# Patient Record
Sex: Female | Born: 1985 | Race: White | Hispanic: No | Marital: Married | Smoking: Current every day smoker
Health system: Southern US, Community
[De-identification: ages and names within clinical notes are randomized; demographics above are authoritative.]

## PROBLEM LIST (undated history)

## (undated) DIAGNOSIS — R569 Unspecified convulsions: Secondary | ICD-10-CM

## (undated) DIAGNOSIS — F329 Major depressive disorder, single episode, unspecified: Secondary | ICD-10-CM

## (undated) DIAGNOSIS — F32A Depression, unspecified: Secondary | ICD-10-CM

## (undated) DIAGNOSIS — F419 Anxiety disorder, unspecified: Secondary | ICD-10-CM

---
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
## 2006-05-31 ENCOUNTER — Emergency Department (HOSPITAL_COMMUNITY): Admission: EM | Admit: 2006-05-31 | Discharge: 2006-05-31 | Payer: Self-pay | Admitting: Emergency Medicine

## 2009-03-08 ENCOUNTER — Emergency Department (HOSPITAL_COMMUNITY): Admission: EM | Admit: 2009-03-08 | Discharge: 2009-03-08 | Payer: Self-pay | Admitting: Emergency Medicine

## 2009-10-23 ENCOUNTER — Emergency Department (HOSPITAL_COMMUNITY): Admission: EM | Admit: 2009-10-23 | Discharge: 2009-10-23 | Payer: Self-pay | Admitting: Emergency Medicine

## 2009-11-08 ENCOUNTER — Emergency Department (HOSPITAL_COMMUNITY): Admission: EM | Admit: 2009-11-08 | Discharge: 2009-11-08 | Payer: Self-pay | Admitting: Emergency Medicine

## 2009-12-15 ENCOUNTER — Encounter: Admission: RE | Admit: 2009-12-15 | Discharge: 2009-12-15 | Payer: Self-pay | Admitting: Neurology

## 2010-04-11 ENCOUNTER — Emergency Department (HOSPITAL_COMMUNITY): Admission: EM | Admit: 2010-04-11 | Discharge: 2010-04-12 | Payer: Self-pay | Admitting: Emergency Medicine

## 2010-07-22 ENCOUNTER — Emergency Department (HOSPITAL_COMMUNITY): Admission: EM | Admit: 2010-07-22 | Discharge: 2010-07-22 | Payer: Self-pay | Admitting: Emergency Medicine

## 2010-11-10 ENCOUNTER — Emergency Department (HOSPITAL_COMMUNITY): Admission: EM | Admit: 2010-11-10 | Discharge: 2010-01-05 | Payer: Self-pay | Admitting: Emergency Medicine

## 2011-02-16 LAB — URINALYSIS, ROUTINE W REFLEX MICROSCOPIC
Glucose, UA: NEGATIVE mg/dL
Ketones, ur: >80 mg/dL — AB
Nitrite: NEGATIVE
Protein, ur: 30 mg/dL — AB
Specific Gravity, Urine: 1.025 (ref 1.005–1.030)
Urobilinogen, UA: 0.2 mg/dL (ref 0.0–1.0)
pH: 6 (ref 5.0–8.0)

## 2011-02-16 LAB — DIFFERENTIAL
Basophils Absolute: 0 10*3/uL (ref 0.0–0.1)
Eosinophils Relative: 0 % (ref 0–5)
Lymphocytes Relative: 13 % (ref 12–46)
Lymphs Abs: 1.3 10*3/uL (ref 0.7–4.0)
Neutro Abs: 8.4 10*3/uL — ABNORMAL HIGH (ref 1.7–7.7)
Neutrophils Relative %: 84 % — ABNORMAL HIGH (ref 43–77)

## 2011-02-16 LAB — BASIC METABOLIC PANEL
BUN: 5 mg/dL — ABNORMAL LOW (ref 6–23)
Calcium: 9.2 mg/dL (ref 8.4–10.5)
Chloride: 102 mEq/L (ref 96–112)
Creatinine, Ser: 0.63 mg/dL (ref 0.4–1.2)
GFR calc Af Amer: >60 mL/min (ref >60)
GFR calc non Af Amer: >60 mL/min (ref >60)

## 2011-02-16 LAB — URINE MICROSCOPIC-ADD ON

## 2011-02-16 LAB — CBC
Platelets: 339 10*3/uL (ref 150–400)
RBC: 3.92 MIL/uL (ref 3.87–5.11)
RDW: 13 % (ref 11.5–15.5)
WBC: 10 10*3/uL (ref 4.0–10.5)

## 2011-02-21 LAB — URINALYSIS, ROUTINE W REFLEX MICROSCOPIC
Glucose, UA: NEGATIVE mg/dL
Ketones, ur: NEGATIVE mg/dL
Nitrite: POSITIVE — AB
Specific Gravity, Urine: 1.025 (ref 1.005–1.030)
pH: 7 (ref 5.0–8.0)

## 2011-02-21 LAB — URINE MICROSCOPIC-ADD ON

## 2011-02-21 LAB — PREGNANCY, URINE: Preg Test, Ur: POSITIVE

## 2011-03-07 LAB — COMPREHENSIVE METABOLIC PANEL
ALT: 15 U/L (ref 0–35)
AST: 20 U/L (ref 0–37)
Alkaline Phosphatase: 63 U/L (ref 39–117)
Calcium: 9.1 mg/dL (ref 8.4–10.5)
GFR calc Af Amer: >60 mL/min (ref >60)
Potassium: 4.2 mEq/L (ref 3.5–5.1)
Sodium: 138 mEq/L (ref 135–145)
Total Protein: 6.8 g/dL (ref 6.0–8.3)

## 2011-03-07 LAB — CBC
HCT: 40.2 % (ref 36.0–46.0)
Hemoglobin: 13.6 g/dL (ref 12.0–15.0)
MCHC: 33.8 g/dL (ref 30.0–36.0)
MCV: 94.8 fL (ref 78.0–100.0)
Platelets: 309 10*3/uL (ref 150–400)
RBC: 4.24 MIL/uL (ref 3.87–5.11)
RDW: 13.8 % (ref 11.5–15.5)
WBC: 11.3 10*3/uL — ABNORMAL HIGH (ref 4.0–10.5)

## 2011-03-07 LAB — DIFFERENTIAL
Eosinophils Absolute: 0 10*3/uL (ref 0.0–0.7)
Eosinophils Relative: 0 % (ref 0–5)
Lymphs Abs: 1.1 10*3/uL (ref 0.7–4.0)
Monocytes Absolute: 0.5 10*3/uL (ref 0.1–1.0)
Monocytes Relative: 4 % (ref 3–12)

## 2011-03-07 LAB — RAPID URINE DRUG SCREEN, HOSP PERFORMED
Amphetamines: NOT DETECTED
Cocaine: NOT DETECTED
Opiates: NOT DETECTED
Tetrahydrocannabinol: POSITIVE — AB

## 2011-03-07 LAB — ETHANOL: Alcohol, Ethyl (B): <5 mg/dL (ref 0–10)

## 2011-03-07 LAB — PREGNANCY, URINE: Preg Test, Ur: NEGATIVE

## 2011-03-08 LAB — COMPREHENSIVE METABOLIC PANEL
Albumin: 4.1 g/dL (ref 3.5–5.2)
BUN: 8 mg/dL (ref 6–23)
Chloride: 105 mEq/L (ref 96–112)
Creatinine, Ser: 0.77 mg/dL (ref 0.4–1.2)
GFR calc non Af Amer: >60 mL/min (ref >60)
Total Bilirubin: 0.7 mg/dL (ref 0.3–1.2)

## 2011-03-08 LAB — DIFFERENTIAL
Basophils Absolute: 0 10*3/uL (ref 0.0–0.1)
Lymphocytes Relative: 11 % — ABNORMAL LOW (ref 12–46)
Neutro Abs: 9.6 10*3/uL — ABNORMAL HIGH (ref 1.7–7.7)

## 2011-03-08 LAB — CBC
HCT: 38.7 % (ref 36.0–46.0)
MCV: 93.5 fL (ref 78.0–100.0)
Platelets: 351 10*3/uL (ref 150–400)
WBC: 11.3 10*3/uL — ABNORMAL HIGH (ref 4.0–10.5)

## 2011-03-08 LAB — URINALYSIS, ROUTINE W REFLEX MICROSCOPIC
Bilirubin Urine: NEGATIVE
Glucose, UA: NEGATIVE mg/dL
Hgb urine dipstick: NEGATIVE
Ketones, ur: NEGATIVE mg/dL
Leukocytes, UA: NEGATIVE
pH: 8.5 — ABNORMAL HIGH (ref 5.0–8.0)

## 2011-03-08 LAB — URINE CULTURE: Colony Count: >=100000

## 2011-03-08 LAB — URINE MICROSCOPIC-ADD ON

## 2011-03-08 LAB — RAPID URINE DRUG SCREEN, HOSP PERFORMED
Barbiturates: NOT DETECTED
Cocaine: NOT DETECTED
Opiates: NOT DETECTED

## 2012-08-21 DIAGNOSIS — R079 Chest pain, unspecified: Secondary | ICD-10-CM

## 2012-08-28 ENCOUNTER — Emergency Department (HOSPITAL_COMMUNITY)
Admission: EM | Admit: 2012-08-28 | Discharge: 2012-08-28 | Disposition: A | Discharge status: Home / Self Care | Payer: Medicaid Other | Source: Home / Self Care | Attending: Emergency Medicine | Admitting: Emergency Medicine

## 2012-08-28 ENCOUNTER — Emergency Department (HOSPITAL_COMMUNITY)
Admission: EM | Admit: 2012-08-28 | Discharge: 2012-08-28 | Disposition: A | Discharge status: Home / Self Care | Payer: Medicaid Other | Attending: Emergency Medicine | Admitting: Emergency Medicine

## 2012-08-28 ENCOUNTER — Encounter (HOSPITAL_COMMUNITY): Payer: Self-pay | Admitting: Emergency Medicine

## 2012-08-28 DIAGNOSIS — F430 Acute stress reaction: Secondary | ICD-10-CM

## 2012-08-28 DIAGNOSIS — Z760 Encounter for issue of repeat prescription: Secondary | ICD-10-CM

## 2012-08-28 DIAGNOSIS — F17200 Nicotine dependence, unspecified, uncomplicated: Secondary | ICD-10-CM | POA: Insufficient documentation

## 2012-08-28 DIAGNOSIS — G40909 Epilepsy, unspecified, not intractable, without status epilepticus: Secondary | ICD-10-CM | POA: Insufficient documentation

## 2012-08-28 DIAGNOSIS — Z79899 Other long term (current) drug therapy: Secondary | ICD-10-CM | POA: Insufficient documentation

## 2012-08-28 DIAGNOSIS — R4589 Other symptoms and signs involving emotional state: Secondary | ICD-10-CM | POA: Insufficient documentation

## 2012-08-28 HISTORY — DX: Unspecified convulsions: R56.9

## 2012-08-28 HISTORY — DX: Depression, unspecified: F32.A

## 2012-08-28 HISTORY — DX: Major depressive disorder, single episode, unspecified: F32.9

## 2012-08-28 HISTORY — DX: Anxiety disorder, unspecified: F41.9

## 2012-08-28 MED ORDER — ALPRAZOLAM 1 MG PO TABS: ORAL_TABLET | ORAL | Status: DC

## 2012-08-28 MED ORDER — LAMOTRIGINE 100 MG PO TABS: 100.0000 mg | ORAL_TABLET | Freq: Every day | ORAL | Status: DC

## 2012-08-28 MED ORDER — ALPRAZOLAM 0.5 MG PO TABS
1.0000 mg | ORAL_TABLET | Freq: Once | ORAL | Status: AC
  Administered 2012-08-28: 1 mg via ORAL
  Filled 2012-08-28: qty 2

## 2012-08-28 MED ORDER — LORAZEPAM 1 MG PO TABS
1.0000 mg | ORAL_TABLET | Freq: Once | ORAL | Status: DC
  Filled 2012-08-28: qty 1

## 2012-08-28 MED ORDER — LAMOTRIGINE 100 MG PO TABS
100.0000 mg | ORAL_TABLET | Freq: Once | ORAL | Status: AC
  Administered 2012-08-28: 100 mg via ORAL
  Filled 2012-08-28: qty 1

## 2012-08-28 MED ORDER — LORAZEPAM 1 MG PO TABS: 1.0000 mg | ORAL_TABLET | Freq: Three times a day (TID) | ORAL | Status: DC | PRN

## 2012-08-28 NOTE — ED Notes (Signed)
Pt was unhappy with prescription for ativan. Pt states ativan does not work for her. EDP notified and prescription was changed to xanax.

## 2012-08-28 NOTE — ED Provider Notes (Signed)
Medical screening examination/treatment/procedure(s) were performed by non-physician practitioner and as supervising physician I was immediately available for consultation/collaboration.   Carleene Cooper III, MD 08/28/12 2019

## 2012-08-28 NOTE — ED Provider Notes (Signed)
History     CSN: [id number]  Arrival date & time 08/28/12  2725   First MD Initiated Contact with Patient 08/28/12 1018      Chief Complaint  Patient presents with  . Weakness  . Altered Mental Status    (Consider location/radiation/quality/duration/timing/severity/associated sxs/prior treatment) HPI Comments: Pt was seen earlier this AM by dr./ davidson for anxiety.  She had been discharged and was sitting in the waiting room when she says she felt like she was going to have a seizure.  She has not had one in about 2 years.  She is on lamictal 100 mg BID but ran out 2 days ago.    Her MD has not called in her rx and asks if i could write the rx for her.  She denies any other issues.  Patient is a 26 y.o. female presenting with altered mental status. The history is provided by the patient. No language interpreter was used.  Altered Mental Status Associated symptoms include weakness. Pertinent negatives include no fever.    Past Medical History  Diagnosis Date  . Seizures   . Anxiety   . Depression     Past Surgical History  Procedure Date  . Cesarean section     History reviewed. No pertinent family history.  History  Substance Use Topics  . Smoking status: Current Every Day Smoker  . Smokeless tobacco: Not on file  . Alcohol Use: Yes    OB History    Grav Para Term Preterm Abortions TAB SAB Ect Mult Living                  Review of Systems  Constitutional: Negative for fever.  Neurological: Positive for weakness. Negative for seizures.  Psychiatric/Behavioral: Positive for altered mental status. Negative for confusion. The patient is nervous/anxious.   All other systems reviewed and are negative.    Allergies  Review of patient's allergies indicates no known allergies.  Home Medications   Current Outpatient Rx  Name Route Sig Dispense Refill  . ALPRAZOLAM 0.5 MG PO TABS Oral Take 0.5 mg by mouth at bedtime as needed.    . ALPRAZOLAM 1 MG PO TABS   Take Xanax 1 mg every 6 ho andurs as needed for anxiety. 30 tablet 0  . LAMOTRIGINE 100 MG PO TABS Oral Take 200 mg by mouth daily.    Marland Kitchen LAMOTRIGINE 100 MG PO TABS Oral Take 1 tablet (100 mg total) by mouth daily. 60 tablet 0  . LAMOTRIGINE 100 MG PO TABS Oral Take 1 tablet (100 mg total) by mouth daily. 60 tablet 0    BP 114/79  Pulse 93  Temp 97.9 F (36.6 C) (Oral)  Resp 17  SpO2 95%  LMP 08/04/2012  Physical Exam  Nursing note and vitals reviewed. Constitutional: She is oriented to person, place, and time. She appears well-developed and well-nourished.  HENT:  Head: Normocephalic.  Eyes: EOM are normal.  Neck: Normal range of motion.  Pulmonary/Chest: Effort normal.  Abdominal: She exhibits no distension.  Musculoskeletal: Normal range of motion.  Neurological: She is alert and oriented to person, place, and time. She has normal strength. No cranial nerve deficit or sensory deficit. Coordination and gait normal. GCS eye subscore is 4. GCS verbal subscore is 5. GCS motor subscore is 6.       Pt walking around room talking on her cell phone.  Skin: Skin is warm and dry.  Psychiatric: She has a normal mood and affect.  ED Course  Procedures (including critical care time)   Labs Reviewed  PREGNANCY, URINE   No results found.   1. Prescription refill       MDM  rx-lamictal 100 mg, 60 F/u with PCP        Evalina Field, PA 08/28/12 1119

## 2012-08-28 NOTE — ED Notes (Signed)
Pt just here this am for anxiety and released. Was waiting on ride and states she felt disoriented and like she was going to have a seizure. Has been feeling this way x 10 minutes now. Last seizure x 2 years ago. Out of seizure meds x 2 days. nad at this time. Pt is alert/oriented.
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
## 2013-05-11 ENCOUNTER — Other Ambulatory Visit: Payer: Self-pay

## 2013-05-11 MED ORDER — LAMOTRIGINE ER 100 MG PO TB24: 300.0000 mg | ORAL_TABLET | Freq: Every day | ORAL | Status: DC

## 2014-01-05 ENCOUNTER — Telehealth: Payer: Self-pay | Admitting: Neurology

## 2014-01-05 NOTE — Telephone Encounter (Signed)
Medicaid has changed their formulary.  They do not cover Lamictal XR.  I have contacted them and asked that they grant an exception, as the patient is stable on this medication.  Pending response.  I called the patient back.  Got no answer.  Left message.

## 2014-01-05 NOTE — Telephone Encounter (Signed)
REGARDING PRIOR AUTHORIZATION FROM MEDICAID FOR LAMICTAL XR 300MG 

## 2014-01-09 ENCOUNTER — Other Ambulatory Visit: Payer: Self-pay

## 2014-01-09 MED ORDER — LAMOTRIGINE ER 100 MG PO TB24: 300.0000 mg | ORAL_TABLET | Freq: Every day | ORAL | Status: DC

## 2014-02-06 ENCOUNTER — Telehealth: Payer: Self-pay | Admitting: *Deleted

## 2014-02-06 MED ORDER — LAMOTRIGINE ER 100 MG PO TB24: 300.0000 mg | ORAL_TABLET | Freq: Every day | ORAL | Status: DC

## 2014-02-06 NOTE — Telephone Encounter (Signed)
Rx has been sent  

## 2014-03-10 ENCOUNTER — Telehealth: Payer: Self-pay | Admitting: Neurology

## 2014-03-10 NOTE — Telephone Encounter (Signed)
Pt called.  Is completely out of her Lamictal XR.  She stated that they need prior authorization before the pharmacy will refill it.  They are using a new pharmacy that is closer to their home.  Please call and let them know how long it may take to get the authorization as well as if they need to come into the office to pick up samples to last her until the authorization process is completed.  Thank you

## 2014-03-10 NOTE — Telephone Encounter (Signed)
We already faxed the PA info to ins back in February.  I called ins.  Spoke with Selena BattenKim who [facility]  She said the prior Berkley Harveyauth was already approved for this medication (in Feb) Approval # [id number], approval valid for one year.  She said if the pharmacy is still having issues, they will need to contact Medicaid, as it would be an internal error.  Call Ref # [id number].  I called the patient back, got no answer.  Left message.

## 2014-04-17 ENCOUNTER — Encounter: Payer: Self-pay | Admitting: Neurology

## 2014-05-28 ENCOUNTER — Encounter: Payer: Self-pay | Admitting: Neurology

## 2014-06-08 ENCOUNTER — Other Ambulatory Visit: Payer: Self-pay | Admitting: Neurology

## 2014-06-08 NOTE — Telephone Encounter (Signed)
Patient has appt scheduled

## 2014-06-23 ENCOUNTER — Ambulatory Visit: Payer: Self-pay | Admitting: Neurology

## 2014-07-06 ENCOUNTER — Ambulatory Visit: Payer: Self-pay | Admitting: Neurology

## 2014-07-08 ENCOUNTER — Other Ambulatory Visit: Payer: Self-pay | Admitting: Neurology

## 2014-07-08 NOTE — Telephone Encounter (Signed)
Patient has appt scheduled

## 2014-07-10 ENCOUNTER — Encounter: Payer: Self-pay | Admitting: Neurology

## 2014-07-10 DIAGNOSIS — R569 Unspecified convulsions: Secondary | ICD-10-CM | POA: Insufficient documentation

## 2014-07-10 DIAGNOSIS — F329 Major depressive disorder, single episode, unspecified: Secondary | ICD-10-CM

## 2014-07-10 DIAGNOSIS — F419 Anxiety disorder, unspecified: Secondary | ICD-10-CM | POA: Insufficient documentation

## 2014-07-10 DIAGNOSIS — F32A Depression, unspecified: Secondary | ICD-10-CM | POA: Insufficient documentation

## 2014-07-13 ENCOUNTER — Ambulatory Visit (INDEPENDENT_AMBULATORY_CARE_PROVIDER_SITE_OTHER): Payer: Medicaid Other | Admitting: Neurology

## 2014-07-13 ENCOUNTER — Encounter: Payer: Self-pay | Admitting: Neurology

## 2014-07-13 VITALS — BP 120/79 | HR 98 | Wt 144.0 lb

## 2014-07-13 DIAGNOSIS — F329 Major depressive disorder, single episode, unspecified: Secondary | ICD-10-CM

## 2014-07-13 DIAGNOSIS — F3289 Other specified depressive episodes: Secondary | ICD-10-CM

## 2014-07-13 DIAGNOSIS — R569 Unspecified convulsions: Secondary | ICD-10-CM

## 2014-07-13 DIAGNOSIS — F32A Depression, unspecified: Secondary | ICD-10-CM

## 2014-07-13 MED ORDER — LAMOTRIGINE ER 100 MG PO TB24: 300.0000 mg | ORAL_TABLET | Freq: Every day | ORAL | Status: DC

## 2014-07-13 NOTE — Progress Notes (Addendum)
PATIENT: Debbie Schwartz DOB: 13-Sep-1986  HISTORICAL  Debbie Schwartz is 28 years old right-handed Caucasian female, accompanied by her husband, referred by her primary care physician for evaluation of anxiety, difficulty sleeping  most recent visit was in October 2013 with nurse practitioner Cecille Rubin, she presented with seizures since 2010, had first seizure reported in October 15 2009, generalized tonic-clonic seizure, CAT scan was essentially normal, MRI of the brain, and EEG was normal as well.  She had recurrent seizure November 08 2009, was loaded with Keppra at emergency room, later due to her complains of mood disorder, she was switched to Lamictal, currently taking lamotrigin 100 mg 3 tablets every night, tolerating well,  She went through normal pregnancy twice, while taking lamotrigine, reported last seizure was in 2011   REVIEW OF SYSTEMS: Full 14 system review of systems performed and notable only for memory loss, seizure, depression, anxiety  ALLERGIES: No Known Allergies  HOME MEDICATIONS: Current Outpatient Prescriptions on File Prior to Visit  Medication Sig Dispense Refill  . LamoTRIgine 100 MG TB24 TAKE (3) TABLETS BY MOUTH ONCE DAILY.  90 tablet  0  . ALPRAZolam (XANAX) 0.5 MG tablet Take 0.5 mg by mouth at bedtime as needed.      . ALPRAZolam (XANAX) 1 MG tablet Take Xanax 1 mg every 6 ho andurs as needed for anxiety.  30 tablet  0     PAST MEDICAL HISTORY: Past Medical History  Diagnosis Date  . Seizures   . Anxiety   . Depression     PAST SURGICAL HISTORY: Past Surgical History  Procedure Laterality Date  . Cesarean section      FAMILY HISTORY: Family History  Problem Relation Age of Onset  . Diabetes Father     SOCIAL HISTORY:  History   Social History  . Marital Status: Married    Spouse Name: N/A    Number of Children: 3  . Years of Education: N/A   Occupational History  . Not on file.   Social History Main Topics    . Smoking status: Current Every Day Smoker  . Smokeless tobacco: Never Used  . Alcohol Use: Yes  . Drug Use: Yes    Special: Marijuana  . Sexual Activity: Yes    Birth Control/ Protection: None   Other Topics Concern  . Not on file   Social History Narrative  . No narrative on file     PHYSICAL EXAM   Filed Vitals:   07/13/14 1110  BP: 120/79  Pulse: 98  Weight: 144 lb (65.318 kg)   Body mass index is 24.71 kg/(m^2).   Generalized: In no acute distress  Neck: Supple, no carotid bruits   Cardiac: Regular rate rhythm  Pulmonary: Clear to auscultation bilaterally  Musculoskeletal: No deformity  Neurological examination  Mentation: Alert oriented to time, place, history taking, and causual conversation  Cranial nerve II-XII: Pupils were equal round reactive to light. Extraocular movements were full.  Visual field were full on confrontational test. Bilateral fundi were sharp.  Facial sensation and strength were normal. Hearing was intact to finger rubbing bilaterally. Uvula tongue midline.  Head turning and shoulder shrug and were normal and symmetric.Tongue protrusion into cheek strength was normal.  Motor: Normal tone, bulk and strength.  Sensory: Intact to fine touch, pinprick, preserved vibratory sensation, and proprioception at toes.  Coordination: Normal finger to nose, heel-to-shin bilaterally there was no truncal ataxia  Gait: Rising up from seated position without assistance, normal  stance, without trunk ataxia, moderate stride, good arm swing, smooth turning, able to perform tiptoe, and heel walking without difficulty.   Romberg signs: Negative  Deep tendon reflexes: Brachioradialis 2/2, biceps 2/2, triceps 2/2, patellar 2/2, Achilles 2/2, plantar responses were flexor bilaterally.   DIAGNOSTIC DATA (LABS, IMAGING, TESTING) - I reviewed patient records, labs, notes, testing and imaging myself where available.  Lab Results  Component Value Date   WBC  10.0 07/22/2010   HGB 12.6 07/22/2010   HCT 36.8 07/22/2010   MCV 94.1 07/22/2010   PLT 339 07/22/2010      Component Value Date/Time   NA 135 07/22/2010 1855   K 3.2* 07/22/2010 1855   CL 102 07/22/2010 1855   CO2 23 07/22/2010 1855   GLUCOSE 92 07/22/2010 1855   BUN 5* 07/22/2010 1855   CREATININE 0.63 07/22/2010 1855   CALCIUM 9.2 07/22/2010 1855   PROT 6.8 11/08/2009 0842   ALBUMIN 3.8 11/08/2009 0842   AST 20 11/08/2009 0842   ALT 15 11/08/2009 0842   ALKPHOS 63 11/08/2009 0842   BILITOT 0.4 11/08/2009 0842   GFRNONAA >60 07/22/2010 1855   GFRAA  Value: >60        The eGFR has been calculated using the MDRD equation. This calculation has not been validated in all clinical situations. eGFR's persistently <60 mL/min signify possible Chronic Kidney Disease. 07/22/2010 1855  ASSESSMENT AND PLAN  Debbie Schwartz is a 29 y.o. female complains of  Depression, anxiety, history of seizure. Her seizures well controlled with Lamotrigin 100 mg 3 tablets every night, her main concern is her mood disorder, depression anxiety insomnia, I refilled her lamotrigin today, referred her to psychologist, she may continue later refills with her primary care physician     Marcial Pacas, M.D. Ph.D.  [facility] [address] [place], [place] [address] [phone]

## 2014-07-14 ENCOUNTER — Telehealth: Payer: Self-pay

## 2014-07-14 NOTE — Telephone Encounter (Signed)
Sp[facility] with [facility] at [facility] [phone] and they will call me back today with appt for patient as far as her psychiatry appt. Patient has to be set with a clinical assemment first.

## 2014-07-14 NOTE — Telephone Encounter (Signed)
Rhonda from [facility] calling to state that she has successfully scheduled an appointment for patient, if questions, please call and advise.

## 2014-07-15 NOTE — Telephone Encounter (Signed)
Patient called me back. All information about appt was given to her and she will be at her appt. @ 1:30 pm 07/16/2014.

## 2014-07-15 NOTE — Telephone Encounter (Signed)
Debbie Schwartz Called back and she is scheduled for appt  07-15-2014. At [facility] [place]  [address]  [address] [place] if patient needs to reschedule call [phone]. Please bring insurance card and her medication.  Called patient and left her a message asking for her to call me back so I could discuss details.

## 2014-07-15 NOTE — Telephone Encounter (Addendum)
Patients appt is at 1:30 Pm 07-16-2014.Called back left her another message to cal me back. I will call [facility] to reschedule her appt because I was unable to reach patient.

## 2014-09-15 ENCOUNTER — Emergency Department (HOSPITAL_COMMUNITY)
Admission: EM | Admit: 2014-09-15 | Discharge: 2014-09-15 | Disposition: A | Discharge status: Home / Self Care | Payer: Medicaid Other | Attending: Emergency Medicine | Admitting: Emergency Medicine

## 2014-09-15 ENCOUNTER — Encounter (HOSPITAL_COMMUNITY): Payer: Self-pay | Admitting: Emergency Medicine

## 2014-09-15 DIAGNOSIS — Z79899 Other long term (current) drug therapy: Secondary | ICD-10-CM | POA: Diagnosis not present

## 2014-09-15 DIAGNOSIS — Z720 Tobacco use: Secondary | ICD-10-CM | POA: Diagnosis not present

## 2014-09-15 DIAGNOSIS — G40909 Epilepsy, unspecified, not intractable, without status epilepticus: Secondary | ICD-10-CM | POA: Insufficient documentation

## 2014-09-15 DIAGNOSIS — F419 Anxiety disorder, unspecified: Secondary | ICD-10-CM | POA: Diagnosis not present

## 2014-09-15 DIAGNOSIS — F329 Major depressive disorder, single episode, unspecified: Secondary | ICD-10-CM | POA: Diagnosis not present

## 2014-09-15 MED ORDER — ALPRAZOLAM 0.5 MG PO TABS
0.5000 mg | ORAL_TABLET | Freq: Once | ORAL | Status: AC
  Administered 2014-09-15: 0.5 mg via ORAL
  Filled 2014-09-15: qty 1

## 2014-09-15 NOTE — ED Provider Notes (Signed)
CSN: [id number]     Arrival date & time 09/15/14  1433 History   First MD Initiated Contact with Patient 09/15/14 1457     Chief Complaint  Patient presents with  . Anxiety     (Consider location/radiation/quality/duration/timing/severity/associated sxs/prior Treatment) Patient is a 28 y.o. female presenting with anxiety. The history is provided by the patient (the pt states she was having a panick attack at her counselar's office.   she feels fine now).  Anxiety This is a recurrent problem. The current episode started less than 1 hour ago. The problem occurs constantly. The problem has been gradually improving. Pertinent negatives include no chest pain, no abdominal pain and no headaches. Nothing aggravates the symptoms. Nothing relieves the symptoms.    Past Medical History  Diagnosis Date  . Seizures   . Anxiety   . Depression    Past Surgical History  Procedure Laterality Date  . Cesarean section     Family History  Problem Relation Age of Onset  . Diabetes Father    History  Substance Use Topics  . Smoking status: Current Every Day Smoker  . Smokeless tobacco: Never Used  . Alcohol Use: Yes   OB History   Grav Para Term Preterm Abortions TAB SAB Ect Mult Living                 Review of Systems  Constitutional: Negative for appetite change and fatigue.  HENT: Negative for congestion, ear discharge and sinus pressure.   Eyes: Negative for discharge.  Respiratory: Negative for cough.   Cardiovascular: Negative for chest pain.  Gastrointestinal: Negative for abdominal pain and diarrhea.  Genitourinary: Negative for frequency and hematuria.  Musculoskeletal: Negative for back pain.  Skin: Negative for rash.  Neurological: Negative for seizures and headaches.  Psychiatric/Behavioral: Positive for agitation. Negative for hallucinations.      Allergies  Review of patient's allergies indicates no known allergies.  Home Medications   Prior to Admission  medications   Medication Sig Start Date End Date Taking? Authorizing Provider  FLUoxetine (PROZAC) 40 MG capsule Take 40 mg by mouth daily.   Yes Historical Provider, MD  LamoTRIgine 100 MG TB24 Take 3 tablets (300 mg total) by mouth daily. 07/13/14  Yes Levert FeinsteinYijun Yan, MD   BP 99/63  Pulse 102  Temp(Src) 98.6 F (37 C) (Oral)  Resp 16  Ht 5\' 4"  (1.626 m)  Wt 145 lb (65.772 kg)  BMI 24.88 kg/m2  SpO2 98% Physical Exam  Constitutional: She is oriented to person, place, and time. She appears well-developed.  HENT:  Head: Normocephalic.  Eyes: Conjunctivae and EOM are normal. No scleral icterus.  Neck: Neck supple. No thyromegaly present.  Cardiovascular: Normal rate and regular rhythm.  Exam reveals no gallop and no friction rub.   No murmur heard. Pulmonary/Chest: No stridor. She has no wheezes. She has no rales. She exhibits no tenderness.  Abdominal: She exhibits no distension. There is no tenderness. There is no rebound.  Musculoskeletal: Normal range of motion. She exhibits no edema.  Lymphadenopathy:    She has no cervical adenopathy.  Neurological: She is oriented to person, place, and time. She exhibits normal muscle tone. Coordination normal.  Skin: No rash noted. No erythema.  Psychiatric: She has a normal mood and affect. Her behavior is normal.  anxiety    ED Course  Procedures (including critical care time) Labs Review Labs Reviewed - No data to display  Imaging Review No results found.   EKG  Interpretation None      MDM   Final diagnoses:  Anxiety        Benny LennertJoseph L Nakenya Theall, MD 09/15/14 1732

## 2014-09-15 NOTE — Discharge Instructions (Signed)
Follow up with your counsel ar next week

## 2014-09-15 NOTE — ED Notes (Signed)
Patient with c/o anxiety while at a counseling session. Patient noted to hyperventilate and c/o numbness to hands while hyperventilating. Patient arrives alert/oriented x 4. No distress.

## 2015-07-19 ENCOUNTER — Telehealth: Payer: Self-pay | Admitting: Neurology

## 2015-07-19 NOTE — Telephone Encounter (Signed)
Please let patient know, it is okay to take her to the as prescribed by her psychiatrist, she should take lamotrigine xr 100 mg 3 tablets every night, jessica, please check on the prior approval process for her.

## 2015-07-19 NOTE — Telephone Encounter (Addendum)
I called Medicaid.  Spoke with Debbie Schwartz.  Provided clinical info.  Request is under review.  (They have her listed as Debbie Schwartz)  I called the patient back to advise ins has been contacted, and also relay providers note.  Got no answer.  Left message.   Medicaid has approved the request for coverage on Lamotrigine ER effective until 07/13/2016 Ref # [id number]

## 2015-07-19 NOTE — Telephone Encounter (Signed)
Pt called and wanted to notify us that she will need prior approval on her medication as well.

## 2015-07-19 NOTE — Telephone Encounter (Signed)
Debbie Schwartz called regarding refill on LamoTRIgine 100 MG TB24 and he wants to make sure that it's ok for her to take Latuda   (prescribed by Psych), since she has seizures

## 2015-08-19 ENCOUNTER — Other Ambulatory Visit: Payer: Self-pay

## 2015-08-19 MED ORDER — LAMOTRIGINE ER 100 MG PO TB24: 300.0000 mg | ORAL_TABLET | Freq: Every day | ORAL | Status: DC

## 2015-08-26 ENCOUNTER — Encounter: Payer: Self-pay | Admitting: Neurology

## 2015-08-26 ENCOUNTER — Ambulatory Visit (INDEPENDENT_AMBULATORY_CARE_PROVIDER_SITE_OTHER): Payer: Medicaid Other | Admitting: Neurology

## 2015-08-26 VITALS — Ht 64.0 in | Wt 141.0 lb

## 2015-08-26 DIAGNOSIS — R569 Unspecified convulsions: Secondary | ICD-10-CM

## 2015-08-26 DIAGNOSIS — G253 Myoclonus: Secondary | ICD-10-CM

## 2015-08-26 DIAGNOSIS — F32A Depression, unspecified: Secondary | ICD-10-CM

## 2015-08-26 DIAGNOSIS — G4719 Other hypersomnia: Secondary | ICD-10-CM

## 2015-08-26 DIAGNOSIS — F329 Major depressive disorder, single episode, unspecified: Secondary | ICD-10-CM

## 2015-08-26 MED ORDER — LAMOTRIGINE ER 100 MG PO TB24: 300.0000 mg | ORAL_TABLET | Freq: Every day | ORAL | Status: DC

## 2015-08-26 NOTE — Progress Notes (Signed)
Chief Complaint  Patient presents with  . Seizures    Reports her last seizure occurred either March or April of this year.  Says her anxiety was also uncontrolled at the time.  She is now seeing a psychiatrist and has been started on Xanax and [place].  . Insomnia    She has been having difficulty falling asleep and has excessive daytime somnolence.      PATIENT: Debbie Schwartz DOB: [birth year]  HISTORICAL  Debbie Schwartz is 29 years old right-handed Caucasian female, accompanied by her husband, referred by her primary care physician for evaluation of anxiety, difficulty sleeping  most recent visit was in October 2013 with nurse practitioner Cecille Rubin, she presented with seizures since 2010, had first seizure reported in October 15 2009, generalized tonic-clonic seizure, CAT scan was essentially normal, MRI of the brain, and EEG was normal as well.  She had recurrent seizure November 08 2009, was loaded with Keppra at emergency room, later due to her complains of mood disorder, she was switched to Lamictal, currently taking lamotrigin 100 mg 3 tablets every night, tolerating well,  She went through normal pregnancy twice, while taking lamotrigine, reported last seizure was in 2013.  UPDATE Aug 26 2015: She has no recurrent seizure, was taken to the emergency room October 2015 for an anxiety episode, happened during her psychotherapy section, with hyperventilation, bilateral hands. Numbness, posturing,  She tolerating lamotrigine xr 100 mg 3 tablets a day, also taking Latuda, her depression is under better control,  Today she complains of excessive fatigue, daytime sleepiness, ESS 10, FSS 43. She also has frequent awakening at night time, frequent body jerking movement.   REVIEW OF SYSTEMS: Full 14 system review of systems performed and notable only for shortness of breath, abdominal pain, back pain, neck pain, neck stiffness, memory loss, seizure, anxiety, daytime sleepiness,  snoring  ALLERGIES: No Known Allergies  HOME MEDICATIONS: Current Outpatient Prescriptions on File Prior to Visit  Medication Sig Dispense Refill  . LamoTRIgine 100 MG TB24 TAKE (3) TABLETS BY MOUTH ONCE DAILY.  90 tablet  0  . ALPRAZolam (XANAX) 0.5 MG tablet Take 0.5 mg by mouth at bedtime as needed.      . ALPRAZolam (XANAX) 1 MG tablet Take Xanax 1 mg every 6 ho andurs as needed for anxiety.  30 tablet  0     PAST MEDICAL HISTORY: Past Medical History  Diagnosis Date  . Seizures   . Anxiety   . Depression     PAST SURGICAL HISTORY: Past Surgical History  Procedure Laterality Date  . Cesarean section      FAMILY HISTORY: Family History  Problem Relation Age of Onset  . Diabetes Father     SOCIAL HISTORY:  History   Social History  . Marital Status: Married    Spouse Name: N/A    Number of Children: 3  . Years of Education: N/A   Occupational History  . Not on file.   Social History Main Topics  . Smoking status: Current Every Day Smoker  . Smokeless tobacco: Never Used  . Alcohol Use: Yes  . Drug Use: Yes    Special: Marijuana  . Sexual Activity: Yes    Birth Control/ Protection: None   Other Topics Concern  . Not on file   Social History Narrative  . No narrative on file     PHYSICAL EXAM   Filed Vitals:   08/26/15 0914  Height: '5\' 4"'  (1.626 m)  Weight: 141  lb (63.957 kg)   Body mass index is 24.19 kg/(m^2).   Generalized: In no acute distress  Neck: Supple, no carotid bruits   Cardiac: Regular rate rhythm  Pulmonary: Clear to auscultation bilaterally  Musculoskeletal: No deformity  Neurological examination  Mentation: Alert oriented to time, place, history taking, and causual conversation  Cranial nerve II-XII: Pupils were equal round reactive to light. Extraocular movements were full.  Visual field were full on confrontational test. Bilateral fundi were sharp.  Facial sensation and strength were normal. Hearing was intact  to finger rubbing bilaterally. Uvula tongue midline.  Head turning and shoulder shrug and were normal and symmetric.Tongue protrusion into cheek strength was normal.  Motor: Normal tone, bulk and strength.  Sensory: Intact to fine touch, pinprick, preserved vibratory sensation, and proprioception at toes.  Coordination: Normal finger to nose, heel-to-shin bilaterally there was no truncal ataxia  Gait: Rising up from seated position without assistance, normal stance, without trunk ataxia, moderate stride, good arm swing, smooth turning, able to perform tiptoe, and heel walking without difficulty.   Romberg signs: Negative  Deep tendon reflexes: Brachioradialis 2/2, biceps 2/2, triceps 2/2, patellar 2/2, Achilles 2/2, plantar responses were flexor bilaterally.   DIAGNOSTIC DATA (LABS, IMAGING, TESTING) - I reviewed patient records, labs, notes, testing and imaging myself where available.  Lab Results  Component Value Date   WBC 10.0 07/22/2010   HGB 12.6 07/22/2010   HCT 36.8 07/22/2010   MCV 94.1 07/22/2010   PLT 339 07/22/2010      Component Value Date/Time   NA 135 07/22/2010 1855   K 3.2* 07/22/2010 1855   CL 102 07/22/2010 1855   CO2 23 07/22/2010 1855   GLUCOSE 92 07/22/2010 1855   BUN 5* 07/22/2010 1855   CREATININE 0.63 07/22/2010 1855   CALCIUM 9.2 07/22/2010 1855   PROT 6.8 11/08/2009 0842   ALBUMIN 3.8 11/08/2009 0842   AST 20 11/08/2009 0842   ALT 15 11/08/2009 0842   ALKPHOS 63 11/08/2009 0842   BILITOT 0.4 11/08/2009 0842   GFRNONAA >60 07/22/2010 1855   GFRAA  07/22/2010 1855    >60        The eGFR has been calculated using the MDRD equation. This calculation has not been validated in all clinical situations. eGFR's persistently <60 mL/min signify possible Chronic Kidney Disease.  ASSESSMENT AND PLAN  Debbie Schwartz is a 29 y.o. female   Generalized seizure  Refilled her lamotrigine xr 100 mg 3 tablets every night  Depression  anxiety  Continue Latuda, lamotrigine Sleep-related myoclonic jerking,   excessive daytime sleepiness, fatigue,ESS score is 10, FSS score is 43  Sleep study refer    Marcial Pacas, M.D. Ph.D.  [facility] [address], [place], [place] [address] [phone]

## 2015-09-09 ENCOUNTER — Ambulatory Visit: Payer: Medicaid Other | Admitting: Neurology

## 2015-11-10 ENCOUNTER — Other Ambulatory Visit (HOSPITAL_COMMUNITY): Payer: Self-pay | Admitting: Family Medicine

## 2015-11-10 DIAGNOSIS — N6310 Unspecified lump in the right breast, unspecified quadrant: Secondary | ICD-10-CM

## 2015-11-16 ENCOUNTER — Ambulatory Visit (HOSPITAL_COMMUNITY)
Admission: RE | Admit: 2015-11-16 | Discharge: 2015-11-16 | Disposition: A | Discharge status: Home / Self Care | Payer: Medicaid Other | Source: Ambulatory Visit | Attending: Family Medicine | Admitting: Family Medicine

## 2015-11-16 DIAGNOSIS — N63 Unspecified lump in breast: Secondary | ICD-10-CM | POA: Diagnosis not present

## 2015-11-16 DIAGNOSIS — N644 Mastodynia: Secondary | ICD-10-CM | POA: Insufficient documentation

## 2015-11-16 DIAGNOSIS — N6310 Unspecified lump in the right breast, unspecified quadrant: Secondary | ICD-10-CM

## 2015-11-24 DIAGNOSIS — Z0271 Encounter for disability determination: Secondary | ICD-10-CM

## 2015-11-30 ENCOUNTER — Telehealth: Payer: Self-pay

## 2015-11-30 ENCOUNTER — Ambulatory Visit: Payer: Medicaid Other | Admitting: Neurology

## 2015-11-30 NOTE — Telephone Encounter (Signed)
Patient did not come to a follow up appointment today.  

## 2015-12-01 ENCOUNTER — Encounter: Payer: Self-pay | Admitting: Neurology

## 2016-02-01 ENCOUNTER — Encounter: Payer: Self-pay | Admitting: Gastroenterology

## 2016-02-18 ENCOUNTER — Encounter: Payer: Self-pay | Admitting: Gastroenterology

## 2016-02-18 ENCOUNTER — Ambulatory Visit: Payer: Medicaid Other | Admitting: Gastroenterology

## 2016-02-18 ENCOUNTER — Telehealth: Payer: Self-pay | Admitting: Gastroenterology

## 2016-02-18 NOTE — Telephone Encounter (Signed)
PATIENT WAS A NO SHOW AND LETTER SENT  °

## 2016-09-27 ENCOUNTER — Encounter: Payer: Self-pay | Admitting: Neurology

## 2016-09-27 ENCOUNTER — Ambulatory Visit (INDEPENDENT_AMBULATORY_CARE_PROVIDER_SITE_OTHER): Payer: Medicaid Other | Admitting: Neurology

## 2016-09-27 VITALS — BP 114/72 | HR 96 | Ht 64.0 in | Wt 146.0 lb

## 2016-09-27 DIAGNOSIS — F419 Anxiety disorder, unspecified: Secondary | ICD-10-CM | POA: Diagnosis not present

## 2016-09-27 DIAGNOSIS — R569 Unspecified convulsions: Secondary | ICD-10-CM

## 2016-09-27 MED ORDER — LAMOTRIGINE ER 100 MG PO TB24: 300.0000 mg | ORAL_TABLET | Freq: Every day | ORAL | 4 refills | Status: DC

## 2016-09-27 NOTE — Addendum Note (Signed)
Addended by: Levert FeinsteinYAN, Dwan Fennel on: 09/27/2016 10:02 AM   Modules accepted: Orders

## 2016-09-27 NOTE — Progress Notes (Addendum)
Chief Complaint  Patient presents with  . Seizures    She is here with her husband, Gwyndolyn Saxon.  Reports one seizure since last seen (estimates it being seven months ago).  Denies missing any medication around the time of this event.      PATIENT: Debbie Schwartz DOB: 1986-05-30  HISTORICAL  Debbie Schwartz is 30 years old right-handed Caucasian female follow-up for seizure,  She had seizures since 2010, had first seizure was in October 15 2009, generalized tonic-clonic seizure, CAT scan was essentially normal, MRI of the brain, and EEG was normal as well.  She had recurrent seizure on November 08 2009, was loaded with Keppra at emergency room, later due to her complains of mood disorder, she was switched to Lamictal, currently taking lamotrigin 100 mg 3 tablets every night, tolerating well,  She went through normal pregnancy twice, while taking lamotrigine, reported last seizure was in 2013.  UPDATE Aug 26 2015: She has no recurrent seizure, was taken to the emergency room October 2015 for an anxiety episode, happened during her psychotherapy section, with hyperventilation, bilateral hands numbness, posturing,  She tolerating lamotrigine xr 100 mg 3 tablets a day, also taking Latuda, her depression is under better control,  Today she complains of excessive fatigue, daytime sleepiness, ESS 10, FSS 43. She also has frequent awakening at night time, frequent body jerking movement.   UPDATE Oct 25th 2017: She had one recurrent episode of seizure-like activity during her psychotherapy section for her anxiety disorder, her hands looked up, she became nervous, become confused, but there was no total loss of consciousness, she has been complying with her lamotrigine XL 100 mg 3 tablets every night, this is only medication she is taking no  REVIEW OF SYSTEMS: Full 14 system review of systems performed and notable only for  seizure   ALLERGIES: No Known Allergies  HOME  MEDICATIONS: Current Outpatient Prescriptions on File Prior to Visit  Medication Sig Dispense Refill  . LamoTRIgine 100 MG TB24 TAKE (3) TABLETS BY MOUTH ONCE DAILY.  90 tablet  0  . ALPRAZolam (XANAX) 0.5 MG tablet Take 0.5 mg by mouth at bedtime as needed.      . ALPRAZolam (XANAX) 1 MG tablet Take Xanax 1 mg every 6 ho andurs as needed for anxiety.  30 tablet  0     PAST MEDICAL HISTORY: Past Medical History:  Diagnosis Date  . Anxiety   . Depression   . Seizures ([place])     PAST SURGICAL HISTORY: Past Surgical History:  Procedure Laterality Date  . CESAREAN SECTION      FAMILY HISTORY: Family History  Problem Relation Age of Onset  . Diabetes Father     SOCIAL HISTORY:  History   Social History  . Marital Status: Married    Spouse Name: N/A    Number of Children: 3  . Years of Education: N/A   Occupational History  . Not on file.   Social History Main Topics  . Smoking status: Current Every Day Smoker  . Smokeless tobacco: Never Used  . Alcohol Use: Yes  . Drug Use: Yes    Special: Marijuana  . Sexual Activity: Yes    Birth Control/ Protection: None   Other Topics Concern  . Not on file   Social History Narrative  . No narrative on file     PHYSICAL EXAM   Vitals:   09/27/16 0931  BP: 114/72  Pulse: 96  Weight: 146 lb (66.2 kg)  Height: '5\' 4"'  (1.626 m)   Body mass index is 25.06 kg/m.   Generalized: In no acute distress  Neck: Supple, no carotid bruits   Cardiac: Regular rate rhythm  Pulmonary: Clear to auscultation bilaterally  Musculoskeletal: No deformity  Neurological examination  Mentation: Alert oriented to time, place, history taking, and causual conversation  Cranial nerve II-XII: Pupils were equal round reactive to light. Extraocular movements were full.  Visual field were full on confrontational test. Bilateral fundi were sharp.  Facial sensation and strength were normal. Hearing was intact to finger rubbing  bilaterally. Uvula tongue midline.  Head turning and shoulder shrug and were normal and symmetric.Tongue protrusion into cheek strength was normal.  Motor: Normal tone, bulk and strength.  Sensory: Intact to fine touch, pinprick, preserved vibratory sensation, and proprioception at toes.  Coordination: Normal finger to nose, heel-to-shin bilaterally there was no truncal ataxia  Gait: Rising up from seated position without assistance, normal stance, without trunk ataxia, moderate stride, good arm swing, smooth turning, able to perform tiptoe, and heel walking without difficulty.   Romberg signs: Negative  Deep tendon reflexes: Brachioradialis 2/2, biceps 2/2, triceps 2/2, patellar 2/2, Achilles 2/2, plantar responses were flexor bilaterally.   DIAGNOSTIC DATA (LABS, IMAGING, TESTING) - I reviewed patient records, labs, notes, testing and imaging myself where available.  Lab Results  Component Value Date   WBC 10.0 07/22/2010   HGB 12.6 07/22/2010   HCT 36.8 07/22/2010   MCV 94.1 07/22/2010   PLT 339 07/22/2010      Component Value Date/Time   NA 135 07/22/2010 1855   K 3.2 (L) 07/22/2010 1855   CL 102 07/22/2010 1855   CO2 23 07/22/2010 1855   GLUCOSE 92 07/22/2010 1855   BUN 5 (L) 07/22/2010 1855   CREATININE 0.63 07/22/2010 1855   CALCIUM 9.2 07/22/2010 1855   PROT 6.8 11/08/2009 0842   ALBUMIN 3.8 11/08/2009 0842   AST 20 11/08/2009 0842   ALT 15 11/08/2009 0842   ALKPHOS 63 11/08/2009 0842   BILITOT 0.4 11/08/2009 0842   GFRNONAA >60 07/22/2010 1855   GFRAA  07/22/2010 1855    >60        The eGFR has been calculated using the MDRD equation. This calculation has not been validated in all clinical situations. eGFR's persistently <60 mL/min signify possible Chronic Kidney Disease.  ASSESSMENT AND PLAN  UNDINE NEALIS is a 30 y.o. female   Generalized seizure  Refilled her lamotrigine xr 100 mg 3 tablets every night  Depression anxiety  She is receiving  psychotherapy Sleep-related myoclonic jerking,   Repeat sleep deprived EEG  Check lamotrigine level    Marcial Pacas, M.D. Ph.D.  [facility] [address], [place], [place] [address] [phone]

## 2016-09-29 ENCOUNTER — Telehealth: Payer: Self-pay | Admitting: *Deleted

## 2016-09-29 LAB — LAMOTRIGINE LEVEL: LAMOTRIGINE LVL: 8.1 ug/mL (ref 2.0–20.0)

## 2016-09-29 NOTE — Telephone Encounter (Signed)
Per Dr Terrace ArabiaYan, LVM informing lamotrigine level was 8.1 which is within the normal limit. Left name, number and office hours for any questions.

## 2016-10-12 ENCOUNTER — Ambulatory Visit (INDEPENDENT_AMBULATORY_CARE_PROVIDER_SITE_OTHER): Payer: Medicaid Other | Admitting: Neurology

## 2016-10-12 DIAGNOSIS — R569 Unspecified convulsions: Secondary | ICD-10-CM

## 2016-10-12 DIAGNOSIS — F419 Anxiety disorder, unspecified: Secondary | ICD-10-CM

## 2016-10-18 NOTE — Procedures (Signed)
   HISTORY: 30 years old female, with history of seizure,  TECHNIQUE:  16 channel EEG was performed based on standard 10-16 international system. One channel was dedicated to EKG, which has demonstrates normal sinus rhythm of 78 beats per minutes.  Upon awakening, the posterior background activity was well-developed, in alpha range, with amplitude of microvoltage, reactive to eye opening and closure.  There was no evidence of epileptiform discharge.  Photic stimulation was performed, which induced a symmetric photic driving.  Hyperventilation was performed, there was no abnormality elicit.  Stage II sleep was achieved.  CONCLUSION: This is a  normal awake and sleep EEG.  There is no electrodiagnostic evidence of epileptiform discharge.  Levert FeinsteinYijun Jontez Redfield, M.D. Ph.D.  [facility] [address] [place], [place] [address] Phone: [phone] Fax:      [phone]

## 2017-08-29 IMAGING — US US BREAST LTD UNI RIGHT INC AXILLA
1 series · 2 of 2 positions shown · non-contrast
Comparison: Previous exam(s).

CLINICAL DATA: Area of tenderness and palpable concern in the right
breast upper outer quadrant, felt by the patient. Patient also
mentioned having nonspontaneous creamy nipple discharge.

EXAM:
ULTRASOUND OF THE RIGHT BREAST

[Series 1: us breast ltd uni right inc axilla · 0.07mm/px · 2 of 2 slices shown]
[im 1/2]
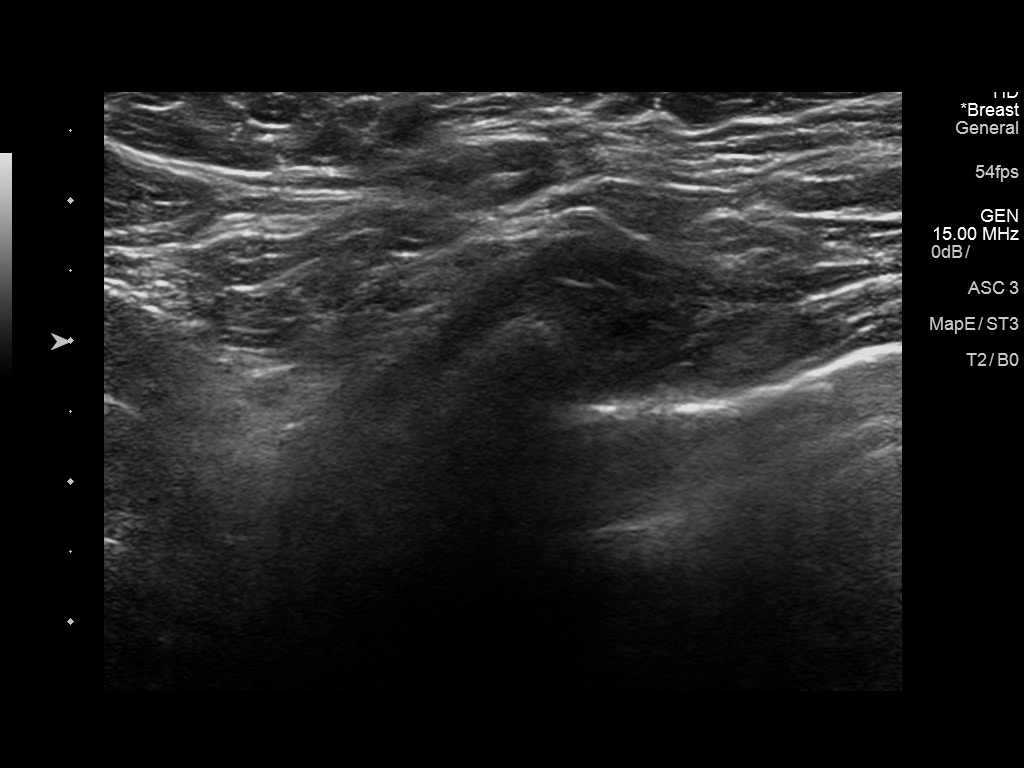
[im 2/2]
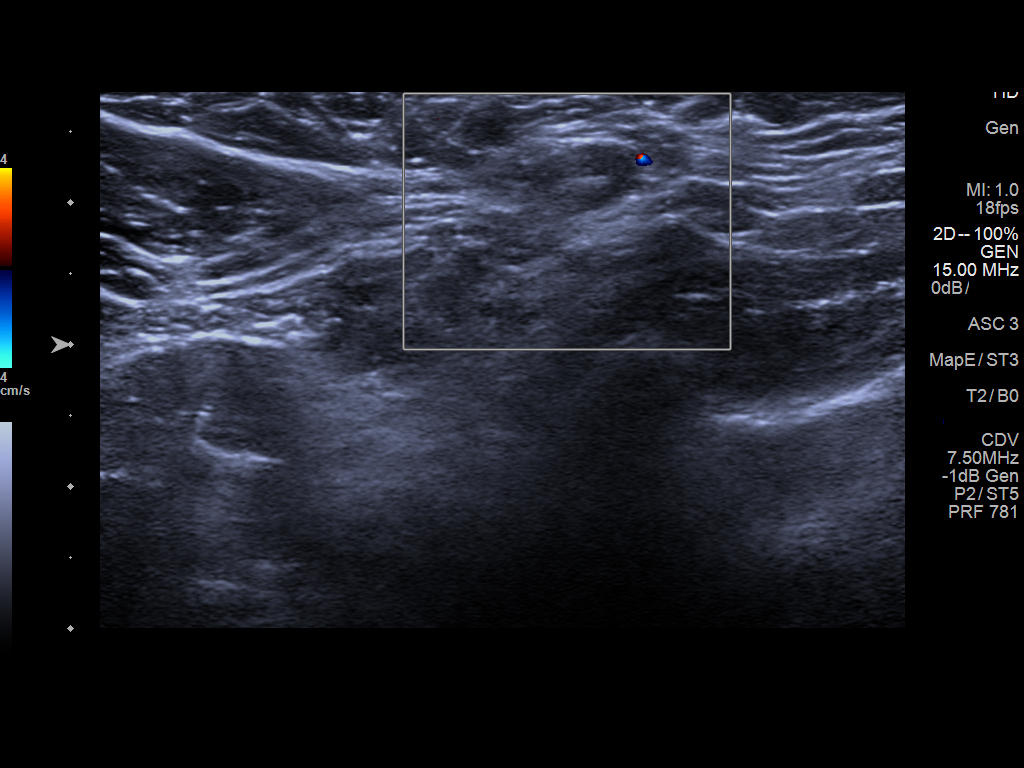

[2 of 2 positions shown; findings below may reference images not displayed]

FINDINGS: On physical exam, there is an area of thickening in the right breast
upper outer quadrant without clearly palpable mass.

Targeted ultrasound is performed, showing no suspicious masses or
shadowing lesions. Ultrasound examination of the right axilla
demonstrates normal appearing lymph nodes.
IMPRESSION: No sonographic evidence of malignancy in the right breast.

Further management of patient's area of palpable concern should be
based on clinical grounds.

RECOMMENDATION:
Screening mammogram at age 41 unless there are persistent or
intervening clinical concerns. (Code:Q1-7-1C1)

I have discussed the findings and recommendations with the patient.
Results were also provided in writing at the conclusion of the
visit. If applicable, a reminder letter will be sent to the patient
regarding the next appointment.

BI-RADS CATEGORY  1: Negative

## 2017-09-26 NOTE — Progress Notes (Signed)
[facility]  PATIENT: Beatrix Fetters DOB: 1986-05-11   REASON FOR VISIT: follow up for seizure disorder HISTORY FROM: patient    HISTORY OF PRESENT ILLNESS:Rajvi L Bugay is 31 years old right-handed Caucasian female follow-up for seizure,  She had seizures since 2010, had first seizure was in October 15 2009, generalized tonic-clonic seizure, CAT scan was essentially normal, MRI of the brain, and EEG was normal as well.  She had recurrent seizure on November 08 2009, was loaded with Keppra at emergency room, later due to her complains of mood disorder, she was switched to Lamictal, currently taking lamotrigin 100 mg 3 tablets every night, tolerating well,  She went through normal pregnancy twice, while taking lamotrigine, reported last seizure was in 2013.  UPDATE Aug 26 2015: She has no recurrent seizure, was taken to the emergency room October 2015 for an anxiety episode, happened during her psychotherapy section, with hyperventilation, bilateral hands numbness, posturing,  She tolerating lamotrigine xr 100 mg 3 tablets a day, also taking Latuda, her depression is under better control,  Today she complains of excessive fatigue, daytime sleepiness, ESS 10, FSS 43. She also has frequent awakening at night time, frequent body jerking movement.   UPDATE Oct 25th 2017:YY She had one recurrent episode of seizure-like activity during her psychotherapy section for her anxiety disorder, her hands looked up, she became nervous, become confused, but there was no total loss of consciousness, she has been complying with her lamotrigine XL 100 mg 3 tablets every night, this is only medication she is taking.  UPDATE 10/25/2018CM Ms. Wiegman, 31 year old female returns for follow-up with history of seizure disorder and anxiety. She denies any seizure events in the last year. Sleep deprived EEG was normal . Lamictal level 8.1 after last visit which is therapeutic.  She denies side effects to Lamictal. She is currently taking 300 mg daily. She was going to a psychotherapist for her anxiety disorder but stopped going when she was told she was bipolar.She returns for reevaluation. She denies any interval medical issues. She does not exercise  REVIEW OF SYSTEMS: Full 14 system review of systems performed and notable only for those listed, all others are neg:  Constitutional: neg  Cardiovascular: neg Ear/Nose/Throat: neg  Skin: neg Eyes: neg Respiratory: neg Gastroitestinal: neg  Hematology/Lymphatic: neg  Endocrine: neg Musculoskeletal:neg Allergy/Immunology: neg Neurological: seizure disorder Psychiatric: anxiety Sleep : neg   ALLERGIES: No Known Allergies  HOME MEDICATIONS: Outpatient Medications Prior to Visit  Medication Sig Dispense Refill  . LamoTRIgine 100 MG TB24 Take 3 tablets (300 mg total) by mouth daily. 270 tablet 4   No facility-administered medications prior to visit.     PAST MEDICAL HISTORY: Past Medical History:  Diagnosis Date  . Anxiety   . Depression   . Seizures ([place])     PAST SURGICAL HISTORY: Past Surgical History:  Procedure Laterality Date  . CESAREAN SECTION      FAMILY HISTORY: Family History  Problem Relation Age of Onset  . Diabetes Father     SOCIAL HISTORY: Social History   Social History  . Marital status: Married    Spouse name: N/A  . Number of children: 2  . Years of education: N/A   Occupational History  . Not on file.   Social History Main Topics  . Smoking status: Current Every Day Smoker  . Smokeless tobacco: Never Used  . Alcohol use Yes  . Drug use: Yes    Types: Marijuana  .  Sexual activity: Yes    Birth control/ protection: None   Other Topics Concern  . Not on file   Social History Narrative  . No narrative on file     PHYSICAL EXAM  Vitals:   09/27/17 1002  BP: 122/77  Pulse: (!) 115  Weight: 135 lb 9.6 oz (61.5 kg)  Height: '5\' 4"'  (1.626 m)   Body  mass index is 23.28 kg/m.  Generalized: Well developed, in no acute distress  Head: normocephalic and atraumatic,. Oropharynx benign  Neck: Supple,  Musculoskeletal: No deformity   Neurological examination   Mentation: Alert oriented to time, place, history taking. Attention span and concentration appropriate. Recent and remote memory intact.  Follows all commands speech and language fluent.   Cranial nerve II-XII: Fundoscopic exam reveals sharp disc margins.Pupils were equal round reactive to light extraocular movements were full, visual field were full on confrontational test. Facial sensation and strength were normal. hearing was intact to finger rubbing bilaterally. Uvula tongue midline. head turning and shoulder shrug were normal and symmetric.Tongue protrusion into cheek strength was normal. Motor: normal bulk and tone, full strength in the BUE, BLE, fine finger movements normal, no pronator drift. No focal weakness Sensory: normal and symmetric to light touch, on the face arms and legs Coordination: finger-nose-finger, heel-to-shin bilaterally, no dysmetria Reflexes: Brachioradialis 2/2, biceps 2/2, triceps 2/2, patellar 2/2, Achilles 2/2, plantar responses were flexor bilaterally. Gait and Station: Rising up from seated position without assistance, normal stance,  moderate stride, good arm swing, smooth turning, able to perform tiptoe, and heel walking without difficulty. Tandem gait is steady  DIAGNOSTIC DATA (LABS, IMAGING, TESTING) - I reviewed patient records, labs, notes, testing and imaging myself where available.  Lab Results  Component Value Date   WBC 10.0 07/22/2010   HGB 12.6 07/22/2010   HCT 36.8 07/22/2010   MCV 94.1 07/22/2010   PLT 339 07/22/2010      Component Value Date/Time   NA 135 07/22/2010 1855   K 3.2 (L) 07/22/2010 1855   CL 102 07/22/2010 1855   CO2 23 07/22/2010 1855   GLUCOSE 92 07/22/2010 1855   BUN 5 (L) 07/22/2010 1855   CREATININE 0.63  07/22/2010 1855   CALCIUM 9.2 07/22/2010 1855   PROT 6.8 11/08/2009 0842   ALBUMIN 3.8 11/08/2009 0842   AST 20 11/08/2009 0842   ALT 15 11/08/2009 0842   ALKPHOS 63 11/08/2009 0842   BILITOT 0.4 11/08/2009 0842   GFRNONAA >60 07/22/2010 1855   GFRAA  07/22/2010 1855    >60        The eGFR has been calculated using the MDRD equation. This calculation has not been validated in all clinical situations. eGFR's persistently <60 mL/min signify possible Chronic Kidney Disease.    ASSESSMENT AND PLAN  31 y.o. year old female  has a past medical history of Anxiety; Depression; and Seizures ([place]). here to follow up for seizure disorder. Sleep deprived EEG after her last visit was normal no evidence of myoclonic jerking. Lamictal level 8.1 which is therapeutic  PLAN: Continue lamotrigineSR 100 mg 3 tabs every night will refill Sleep deprived EEG was normal no evidence of myoclonic jerks during sleep Last Lamictal level 8.1 which his therapeutic, continue same dose Call for any seizure activity Follow-up yearly and when necessary I spent 15 minutes in total face to face time with the patient more than 50% of which was spent counseling and coordination of care, reviewing test results reviewing medications and discussing and  reviewing the diagnosis of seizure disorder and anxiety disorder Dennie Bible, [facility], [facility], APRN  [facility] [address], [place], [place] [address] [phone]

## 2017-09-27 ENCOUNTER — Encounter: Payer: Self-pay | Admitting: Nurse Practitioner

## 2017-09-27 ENCOUNTER — Ambulatory Visit (INDEPENDENT_AMBULATORY_CARE_PROVIDER_SITE_OTHER): Payer: Medicaid Other | Admitting: Nurse Practitioner

## 2017-09-27 ENCOUNTER — Encounter (INDEPENDENT_AMBULATORY_CARE_PROVIDER_SITE_OTHER): Payer: Self-pay

## 2017-09-27 VITALS — BP 122/77 | HR 115 | Ht 64.0 in | Wt 135.6 lb

## 2017-09-27 DIAGNOSIS — R569 Unspecified convulsions: Secondary | ICD-10-CM

## 2017-09-27 DIAGNOSIS — F419 Anxiety disorder, unspecified: Secondary | ICD-10-CM

## 2017-09-27 MED ORDER — LAMOTRIGINE ER 100 MG PO TB24: 300.0000 mg | ORAL_TABLET | Freq: Every day | ORAL | 4 refills | Status: DC

## 2017-09-27 NOTE — Progress Notes (Signed)
I have reviewed and agreed above plan. 

## 2017-09-27 NOTE — Patient Instructions (Signed)
Continue lamotrigineSR 100 mg 3 tabs every night will refill Sleep deprived EEG was normal Last Lamictal level 8.1 which his therapeutic Call for any seizure activity Follow-up yearly and when necessary

## 2017-10-05 ENCOUNTER — Telehealth: Payer: Self-pay | Admitting: Nurse Practitioner

## 2017-10-05 NOTE — Telephone Encounter (Signed)
LMVM for pt on home ok to use nicoderm CQ to assist in stopping smoking.

## 2017-10-05 NOTE — Telephone Encounter (Signed)
Pt husband calling stating that pt has decided to stop smoking and she wants to use Nicoderm CQ but due to seizures wants to know if its okay to use, please call pt on home#

## 2017-10-05 NOTE — Telephone Encounter (Signed)
It is okay for her to use NicoDerm to stop smoking

## 2018-07-15 ENCOUNTER — Telehealth: Payer: Self-pay | Admitting: Nurse Practitioner

## 2018-07-15 NOTE — Telephone Encounter (Signed)
I reinforced her dosing of 3 tablets po qhs.  She will get 30 day supply (90#) and then get 90 day supply after that.  [facility] [place] [facility]  (was able to overide as a lost prescription).  Pt aware.

## 2018-07-15 NOTE — Telephone Encounter (Signed)
Pt called stating she only has one day supply left for   LamoTRIgine 100 MG TB24  And the pharmacy stating she cannot received refills until September. Please call to advise

## 2018-07-15 NOTE — Telephone Encounter (Signed)
I called and spoke to pharmacy, Debbie Schwartz, pt picked up 90 day supply 05-17-18.  She states that she only has 3 tabs left.  This has never happened before.  Pt is taking 3 tablets po qhs.  No one else could have gotten tablets.   Ok to refill early?

## 2018-07-15 NOTE — Telephone Encounter (Signed)
Yes but reinforce the dosage, she should not be out

## 2018-09-05 ENCOUNTER — Encounter (HOSPITAL_COMMUNITY): Payer: Self-pay | Admitting: Emergency Medicine

## 2018-09-05 ENCOUNTER — Emergency Department (HOSPITAL_COMMUNITY): Payer: Medicaid Other

## 2018-09-05 ENCOUNTER — Other Ambulatory Visit: Payer: Self-pay

## 2018-09-05 ENCOUNTER — Emergency Department (HOSPITAL_COMMUNITY)
Admission: EM | Admit: 2018-09-05 | Discharge: 2018-09-05 | Disposition: A | Discharge status: Home / Self Care | Payer: Medicaid Other | Attending: Emergency Medicine | Admitting: Emergency Medicine

## 2018-09-05 DIAGNOSIS — F17210 Nicotine dependence, cigarettes, uncomplicated: Secondary | ICD-10-CM | POA: Insufficient documentation

## 2018-09-05 DIAGNOSIS — K047 Periapical abscess without sinus: Secondary | ICD-10-CM | POA: Insufficient documentation

## 2018-09-05 DIAGNOSIS — R220 Localized swelling, mass and lump, head: Secondary | ICD-10-CM

## 2018-09-05 LAB — CBC WITH DIFFERENTIAL/PLATELET
BASOS ABS: 0 10*3/uL (ref 0.0–0.1)
BASOS PCT: 0 %
EOS ABS: 0.1 10*3/uL (ref 0.0–0.7)
Eosinophils Relative: 1 %
HEMATOCRIT: 38.1 % (ref 36.0–46.0)
HEMOGLOBIN: 12.6 g/dL (ref 12.0–15.0)
Lymphocytes Relative: 15 %
Lymphs Abs: 1.1 10*3/uL (ref 0.7–4.0)
MCH: 32 pg (ref 26.0–34.0)
MCHC: 33.1 g/dL (ref 30.0–36.0)
MCV: 96.7 fL (ref 78.0–100.0)
MONOS PCT: 7 %
Monocytes Absolute: 0.5 10*3/uL (ref 0.1–1.0)
NEUTROS ABS: 5.7 10*3/uL (ref 1.7–7.7)
NEUTROS PCT: 77 %
Platelets: 388 10*3/uL (ref 150–400)
RBC: 3.94 MIL/uL (ref 3.87–5.11)
RDW: 12.4 % (ref 11.5–15.5)
WBC: 7.5 10*3/uL (ref 4.0–10.5)

## 2018-09-05 LAB — COMPREHENSIVE METABOLIC PANEL
ALK PHOS: 66 U/L (ref 38–126)
ALT: 16 U/L (ref 0–44)
AST: 16 U/L (ref 15–41)
Albumin: 3.8 g/dL (ref 3.5–5.0)
Anion gap: 8 (ref 5–15)
BUN: 5 mg/dL — ABNORMAL LOW (ref 6–20)
CALCIUM: 9.1 mg/dL (ref 8.9–10.3)
CO2: 26 mmol/L (ref 22–32)
CREATININE: 0.66 mg/dL (ref 0.44–1.00)
Chloride: 106 mmol/L (ref 98–111)
Glucose, Bld: 90 mg/dL (ref 70–99)
Potassium: 3.8 mmol/L (ref 3.5–5.1)
SODIUM: 140 mmol/L (ref 135–145)
TOTAL PROTEIN: 6.8 g/dL (ref 6.5–8.1)
Total Bilirubin: 0.5 mg/dL (ref 0.3–1.2)

## 2018-09-05 LAB — I-STAT BETA HCG BLOOD, ED (MC, WL, AP ONLY): I-stat hCG, quantitative: <5 m[IU]/mL (ref <5)

## 2018-09-05 MED ORDER — FLUCONAZOLE 200 MG PO TABS: 200.0000 mg | ORAL_TABLET | Freq: Once | ORAL | 0 refills | Status: DC | PRN

## 2018-09-05 MED ORDER — IBUPROFEN 800 MG PO TABS: 800.0000 mg | ORAL_TABLET | Freq: Three times a day (TID) | ORAL | 0 refills | Status: DC | PRN

## 2018-09-05 MED ORDER — SODIUM CHLORIDE 0.9 % IV BOLUS
500.0000 mL | Freq: Once | INTRAVENOUS | Status: AC
  Administered 2018-09-05: 500 mL via INTRAVENOUS

## 2018-09-05 MED ORDER — IOHEXOL 300 MG/ML  SOLN
75.0000 mL | Freq: Once | INTRAMUSCULAR | Status: AC | PRN
  Administered 2018-09-05: 75 mL via INTRAVENOUS

## 2018-09-05 MED ORDER — CLINDAMYCIN HCL 300 MG PO CAPS: 300.0000 mg | ORAL_CAPSULE | Freq: Three times a day (TID) | ORAL | 0 refills | Status: AC

## 2018-09-05 MED ORDER — CLINDAMYCIN PHOSPHATE 600 MG/50ML IV SOLN
600.0000 mg | Freq: Once | INTRAVENOUS | Status: AC
  Administered 2018-09-05: 600 mg via INTRAVENOUS
  Filled 2018-09-05: qty 50

## 2018-09-05 NOTE — ED Triage Notes (Signed)
Pt has RT lower facial swelling from a tooth that is mostly gone. Pt states she has parts of tooth still impacted. Reports that she woke up this morning with facial swelling from lower jaw to RT ear and down neck. Reports that she feels like her throat is swollen. Denies difficulty swallowing or breathing at this time. Pt started taking penicillin yesterday.

## 2018-09-05 NOTE — Discharge Instructions (Signed)
You were seen in the ED today with a dental abscess. We are changing your antibiotics and will have you call your oral surgeon in the morning to discuss the change and arrange an ASAP follow up appointment. Return to the ED with any new or worsening symptoms such as fever, difficulty swallowing, difficulty breathing, or other concerning symptoms to you.

## 2018-09-05 NOTE — ED Provider Notes (Signed)
Emergency Department Provider Note   I have reviewed the triage vital signs and the nursing notes.   HISTORY  Chief Complaint Oral Swelling   HPI Debbie Schwartz is a 32 y.o. female with PMH of seizure and anxiety resents to the emergency department for evaluation of worsening right face swelling in the setting of dental pain.  Patient has a known badly decaying tooth on the right.  She has had swelling there previously and was advised to have the tooth extracted but refused.  She began having pain and mild swelling in the tooth yesterday and presented to her dentist.  She states that she had x-rays and was started on penicillin.  Through the night this morning she is noticed worsening swelling over the right lateral jaw, face, and neck.  She denies any difficulty swallowing or breathing.  Her voice has not changed.  She has not experienced any fevers or shaking chills.  She called her dentist to report the change and was referred to the emergency department.   Past Medical History:  Diagnosis Date  . Anxiety   . Depression   . Seizures [facility])     Patient Active Problem List   Diagnosis Date Noted  . Seizures (HCC)   . Anxiety   . Depression     Past Surgical History:  Procedure Laterality Date  . CESAREAN SECTION      Current Outpatient Rx  . Order #: [id number] Class: Normal  . Order #: [id number] Class: Historical Med  . Order #: [id number] Class: Normal  . Order #: [id number] Class: Normal  . Order #: [id number] Class: Normal    Allergies Patient has no known allergies.  Family History  Problem Relation Age of Onset  . Diabetes Father     Social History Social History   Tobacco Use  . Smoking status: Current Every Day Smoker    Packs/day: 1.00    Types: Cigarettes  . Smokeless tobacco: Never Used  Substance Use Topics  . Alcohol use: Yes    Comment: occasionally   . Drug use: Yes    Types: Marijuana    Comment: last use yesterday    Review of  Systems  Constitutional: No fever/chills Eyes: No visual changes. ENT: No sore throat. Positive right jaw swelling and dental pain.  Cardiovascular: Denies chest pain. Respiratory: Denies shortness of breath. Gastrointestinal: No abdominal pain.  No nausea, no vomiting.  No diarrhea.  No constipation. Genitourinary: Negative for dysuria. Musculoskeletal: Negative for back pain. Skin: Negative for rash. Neurological: Negative for headaches, focal weakness or numbness.  10-point ROS otherwise negative.  ____________________________________________   PHYSICAL EXAM:  VITAL SIGNS: ED Triage Vitals  Enc Vitals Group     BP 09/05/18 1019 103/80     Pulse Rate 09/05/18 1019 90     Resp 09/05/18 1019 16     Temp 09/05/18 1019 98.1 F (36.7 C)     Temp Source 09/05/18 1019 Oral     SpO2 09/05/18 1019 100 %     Weight 09/05/18 1020 145 lb (65.8 kg)     Height 09/05/18 1020 5\' 4"  (1.626 m)     Pain Score 09/05/18 1020 8   Constitutional: Alert and oriented. Well appearing and in no acute distress. Eyes: Conjunctivae are normal. EOMI. PERRL.  Head: Atraumatic. Nose: No congestion/rhinnorhea. Mouth/Throat: Mucous membranes are moist.  Oropharynx non-erythematous. No visible dental abscess. Right lateral swelling over the mandible appreciated externally. No trismus. Soft sub-mandibular compartment.  Neck: No stridor.  Cardiovascular:  Normal rate, regular rhythm. Good peripheral circulation. Grossly normal heart sounds.   Respiratory: Normal respiratory effort.  No retractions. Lungs CTAB. Gastrointestinal: Soft and nontender. No distention.  Musculoskeletal: No lower extremity tenderness nor edema. No gross deformities of extremities. Neurologic:  Normal speech and language. No gross focal neurologic deficits are appreciated.  Skin:  Skin is warm, dry and intact. No rash noted.  ____________________________________________   LABS (all labs ordered are listed, but only abnormal  results are displayed)  Labs Reviewed  COMPREHENSIVE METABOLIC PANEL - Abnormal; Notable for the following components:      Result Value   BUN 5 (*)    All other components within normal limits  CBC WITH DIFFERENTIAL/PLATELET  I-STAT BETA HCG BLOOD, ED ([facility], [facility], [facility] ONLY)   ____________________________________________  RADIOLOGY  Ct Soft Tissue Neck W Contrast  Result Date: 09/05/2018 CLINICAL DATA:  Right dental abscess with maxillofacial swelling. EXAM: CT NECK WITH CONTRAST TECHNIQUE: Multidetector CT imaging of the neck was performed using the standard protocol following the bolus administration of intravenous contrast. CONTRAST:  75mL OMNIPAQUE IOHEXOL 300 MG/ML  SOLN COMPARISON:  None similar FINDINGS: Pharynx and larynx: Chronic adenoid thickening with a small left para median retention cyst. Soft tissue stranding around the right posterior mandible where there is a 7 x 2 mm subperiosteal collection along the buccal surface. This appears to arise from tooth 30 which is severely cavitated and has periapical erosion with tiny lateral defect on coronal reformats. Salivary glands: No primary inflammation. Thyroid: Negative Lymph nodes: Reactive appearing enlargement of submandibular lymph nodes without cavitation. Vascular: Negative Limited intracranial: Negative Visualized orbits: Negative Mastoids and visualized paranasal sinuses: Clear Skeleton: No acute or aggressive finding. Upper chest: Negative for pneumonia IMPRESSION: Odontogenic infection from tooth 30 with 7 x 2 mm subperiosteal abscess along the buccal surface. Electronically Signed   By: Marnee Spring M.D.   On: 09/05/2018 14:13    ____________________________________________   PROCEDURES  Procedure(s) performed:   Procedures  None ____________________________________________   INITIAL IMPRESSION / ASSESSMENT AND PLAN / ED COURSE  Pertinent labs & imaging results that were available during my care of the patient were  reviewed by me and considered in my medical decision making (see chart for details).  Patient presents to the emergency department for evaluation of right mandibular swelling in the setting of dental pain.  She has been on penicillin for the past 24 hours with symptoms worsening.  No signs on exam or symptoms to suspect impending airway compromise.  Given rapid progression of symptoms plan for CT imaging and labs. Will give IV Clindamycin while awaiting testing.   Labs reviewed and negative. CT shows subperiosteal abscess (small) along with buccal surface. No concern on imaging or clinically to suspect developing ludwig's angina. Patient has oral surgery follow up and initial Abx would likely not cover this well. Will transition to Clincamycin. Advised a warm compress and calling oral surgeon in the AM. Discussed ED return precautions in detail.   At this time, I do not feel there is any life-threatening condition present. I have reviewed and discussed all results (EKG, imaging, lab, urine as appropriate), exam findings with patient. I have reviewed nursing notes and appropriate previous records.  I feel the patient is safe to be discharged home without further emergent workup. Discussed usual and customary return precautions. Patient and family (if present) verbalize understanding and are comfortable with this plan.  Patient will follow-up with their primary care provider. If they do not  have a primary care provider, information for follow-up has been provided to them. All questions have been answered.  ____________________________________________  FINAL CLINICAL IMPRESSION(S) / ED DIAGNOSES  Final diagnoses:  Dental abscess  Swelling of right side of face     MEDICATIONS GIVEN DURING THIS VISIT:  Medications  sodium chloride 0.9 % bolus 500 mL (0 mLs Intravenous Stopped 09/05/18 1220)  clindamycin (CLEOCIN) IVPB 600 mg (0 mg Intravenous Stopped 09/05/18 1155)  iohexol (OMNIPAQUE) 300 MG/ML  solution 75 mL (75 mLs Intravenous Contrast Given 09/05/18 1351)     NEW OUTPATIENT MEDICATIONS STARTED DURING THIS VISIT:  Discharge Medication List as of 09/05/2018  2:29 PM    START taking these medications   Details  clindamycin (CLEOCIN) 300 MG capsule Take 1 capsule (300 mg total) by mouth 3 (three) times daily for 7 days., Starting Thu 09/05/2018, Until Thu 09/12/2018, Normal    fluconazole (DIFLUCAN) 200 MG tablet Take 1 tablet (200 mg total) by mouth once as needed for up to 2 doses (if yeast infection symptoms develop)., Starting Thu 09/05/2018, Normal        Note:  This document was prepared using Dragon voice recognition software and may include unintentional dictation errors.  Alona Bene, MD Emergency Medicine    Long, Arlyss Repress, MD 09/05/18 [phone]

## 2018-09-26 NOTE — Progress Notes (Deleted)
[facility]  PATIENT: Debbie Schwartz DOB: [birth year]   REASON FOR VISIT: follow up for seizure disorder HISTORY FROM: patient    HISTORY OF PRESENT ILLNESS:Debbie Schwartz is 32 years old right-handed Caucasian female follow-up for seizure,  She had seizures since 2010, had first seizure was in October 15 2009, generalized tonic-clonic seizure, CAT scan was essentially normal, MRI of the brain, and EEG was normal as well.  She had recurrent seizure on November 08 2009, was loaded with Keppra at emergency room, later due to her complains of mood disorder, she was switched to Lamictal, currently taking lamotrigin 100 mg 3 tablets every night, tolerating well,  She went through normal pregnancy twice, while taking lamotrigine, reported last seizure was in 2013.  UPDATE Aug 26 2015: She has no recurrent seizure, was taken to the emergency room October 2015 for an anxiety episode, happened during her psychotherapy section, with hyperventilation, bilateral hands numbness, posturing,  She tolerating lamotrigine xr 100 mg 3 tablets a day, also taking Latuda, her depression is under better control,  Today she complains of excessive fatigue, daytime sleepiness, ESS 10, FSS 43. She also has frequent awakening at night time, frequent body jerking movement.   UPDATE Oct 25th 2017:YY She had one recurrent episode of seizure-like activity during her psychotherapy section for her anxiety disorder, her hands looked up, she became nervous, become confused, but there was no total loss of consciousness, she has been complying with her lamotrigine XL 100 mg 3 tablets every night, this is only medication she is taking.  UPDATE 10/25/2018CM Debbie Schwartz, 32 year old female returns for follow-up with history of seizure disorder and anxiety. She denies any seizure events in the last year. Sleep deprived EEG was normal . Lamictal level 8.1 after last visit which is therapeutic.  She denies side effects to Lamictal. She is currently taking 300 mg daily. She was going to a psychotherapist for her anxiety disorder but stopped going when she was told she was bipolar.She returns for reevaluation. She denies any interval medical issues. She does not exercise  REVIEW OF SYSTEMS: Full 14 system review of systems performed and notable only for those listed, all others are neg:  Constitutional: neg  Cardiovascular: neg Ear/Nose/Throat: neg  Skin: neg Eyes: neg Respiratory: neg Gastroitestinal: neg  Hematology/Lymphatic: neg  Endocrine: neg Musculoskeletal:neg Allergy/Immunology: neg Neurological: seizure disorder Psychiatric: anxiety Sleep : neg   ALLERGIES: No Known Allergies  HOME MEDICATIONS: Outpatient Medications Prior to Visit  Medication Sig Dispense Refill  . fluconazole (DIFLUCAN) 200 MG tablet Take 1 tablet (200 mg total) by mouth once as needed for up to 2 doses (if yeast infection symptoms develop). 7 tablet 0  . ibuprofen (ADVIL,MOTRIN) 800 MG tablet Take 1 tablet (800 mg total) by mouth every 8 (eight) hours as needed. 21 tablet 0  . LamoTRIgine 100 MG TB24 Take 3 tablets (300 mg total) by mouth daily. 270 tablet 4  . penicillin v potassium (VEETID) 500 MG tablet Take 1 tablet by mouth every 6 (six) hours.  0   No facility-administered medications prior to visit.     PAST MEDICAL HISTORY: Past Medical History:  Diagnosis Date  . Anxiety   . Depression   . Seizures (HCC)     PAST SURGICAL HISTORY: Past Surgical History:  Procedure Laterality Date  . CESAREAN SECTION      FAMILY HISTORY: Family History  Problem Relation Age of Onset  . Diabetes Father     SOCIAL HISTORY:  Social History   Socioeconomic History  . Marital status: Married    Spouse name: Not on file  . Number of children: 2  . Years of education: Not on file  . Highest education level: Not on file  Occupational History  . Not on file  Social Needs  . Financial  resource strain: Not on file  . Food insecurity:    Worry: Not on file    Inability: Not on file  . Transportation needs:    Medical: Not on file    Non-medical: Not on file  Tobacco Use  . Smoking status: Current Every Day Smoker    Packs/day: 1.00    Types: Cigarettes  . Smokeless tobacco: Never Used  Substance and Sexual Activity  . Alcohol use: Yes    Comment: occasionally   . Drug use: Yes    Types: Marijuana    Comment: last use yesterday  . Sexual activity: Yes    Birth control/protection: None  Lifestyle  . Physical activity:    Days per week: Not on file    Minutes per session: Not on file  . Stress: Not on file  Relationships  . Social connections:    Talks on phone: Not on file    Gets together: Not on file    Attends religious service: Not on file    Active member of club or organization: Not on file    Attends meetings of clubs or organizations: Not on file    Relationship status: Not on file  . Intimate partner violence:    Fear of current or ex partner: Not on file    Emotionally abused: Not on file    Physically abused: Not on file    Forced sexual activity: Not on file  Other Topics Concern  . Not on file  Social History Narrative  . Not on file     PHYSICAL EXAM  There were no vitals filed for this visit. There is no height or weight on file to calculate BMI.  Generalized: Well developed, in no acute distress  Head: normocephalic and atraumatic,. Oropharynx benign  Neck: Supple,  Musculoskeletal: No deformity   Neurological examination   Mentation: Alert oriented to time, place, history taking. Attention span and concentration appropriate. Recent and remote memory intact.  Follows all commands speech and language fluent.   Cranial nerve II-XII: Fundoscopic exam reveals sharp disc margins.Pupils were equal round reactive to light extraocular movements were full, visual field were full on confrontational test. Facial sensation and strength  were normal. hearing was intact to finger rubbing bilaterally. Uvula tongue midline. head turning and shoulder shrug were normal and symmetric.Tongue protrusion into cheek strength was normal. Motor: normal bulk and tone, full strength in the BUE, BLE, fine finger movements normal, no pronator drift. No focal weakness Sensory: normal and symmetric to light touch, on the face arms and legs Coordination: finger-nose-finger, heel-to-shin bilaterally, no dysmetria Reflexes: Brachioradialis 2/2, biceps 2/2, triceps 2/2, patellar 2/2, Achilles 2/2, plantar responses were flexor bilaterally. Gait and Station: Rising up from seated position without assistance, normal stance,  moderate stride, good arm swing, smooth turning, able to perform tiptoe, and heel walking without difficulty. Tandem gait is steady  DIAGNOSTIC DATA (LABS, IMAGING, TESTING) - I reviewed patient records, labs, notes, testing and imaging myself where available.  Lab Results  Component Value Date   WBC 7.5 09/05/2018   HGB 12.6 09/05/2018   HCT 38.1 09/05/2018   MCV 96.7 09/05/2018   PLT  388 09/05/2018      Component Value Date/Time   NA 140 09/05/2018 1112   K 3.8 09/05/2018 1112   CL 106 09/05/2018 1112   CO2 26 09/05/2018 1112   GLUCOSE 90 09/05/2018 1112   BUN 5 (L) 09/05/2018 1112   CREATININE 0.66 09/05/2018 1112   CALCIUM 9.1 09/05/2018 1112   PROT 6.8 09/05/2018 1112   ALBUMIN 3.8 09/05/2018 1112   AST 16 09/05/2018 1112   ALT 16 09/05/2018 1112   ALKPHOS 66 09/05/2018 1112   BILITOT 0.5 09/05/2018 1112   GFRNONAA >60 09/05/2018 1112   GFRAA >60 09/05/2018 1112    ASSESSMENT AND PLAN  32 y.o. year old female  has a past medical history of Anxiety, Depression, and Seizures (HCC). here to follow up for seizure disorder. Sleep deprived EEG after her last visit was normal no evidence of myoclonic jerking. Lamictal level 8.1 which is therapeutic  PLAN: Continue lamotrigineSR 100 mg 3 tabs every night will  refill Sleep deprived EEG was normal no evidence of myoclonic jerks during sleep Last Lamictal level 8.1 which his therapeutic, continue same dose Call for any seizure activity Follow-up yearly and when necessary I spent 15 minutes in total face to face time with the patient more than 50% of which was spent counseling and coordination of care, reviewing test results reviewing medications and discussing and reviewing the diagnosis of seizure disorder and anxiety disorder Nilda Riggs, [facility], [facility], APRN  [facility] [address] [place], [place] [address] [phone]

## 2018-09-30 ENCOUNTER — Ambulatory Visit: Payer: Medicaid Other | Admitting: Nurse Practitioner

## 2018-10-01 ENCOUNTER — Encounter: Payer: Self-pay | Admitting: Nurse Practitioner

## 2018-11-19 NOTE — Progress Notes (Deleted)
 [facility]  PATIENT: Debbie Schwartz DOB: 01/22/1986   REASON FOR VISIT: follow up for seizure disorder HISTORY FROM: patient    HISTORY OF PRESENT ILLNESS:[name] is 32 years old right-handed Caucasian female follow-up for seizure,  She had seizures since 2010, had first seizure was in October 15 2009, generalized tonic-clonic seizure, CAT scan was essentially normal, MRI of the brain, and EEG was normal as well.  She had recurrent seizure on November 08 2009, was loaded with Keppra at emergency room, later due to her complains of mood disorder, she was switched to Lamictal, currently taking lamotrigin 100 mg 3 tablets every night, tolerating well,  She went through normal pregnancy twice, while taking lamotrigine, reported last seizure was in 2013.  UPDATE Aug 26 2015: She has no recurrent seizure, was taken to the emergency room October 2015 for an anxiety episode, happened during her psychotherapy section, with hyperventilation, bilateral hands numbness, posturing,  She tolerating lamotrigine xr 100 mg 3 tablets a day, also taking Latuda, her depression is under better control,  Today she complains of excessive fatigue, daytime sleepiness, ESS 10, FSS 43. She also has frequent awakening at night time, frequent body jerking movement.   UPDATE Oct 25th 2017:YY She had one recurrent episode of seizure-like activity during her psychotherapy section for her anxiety disorder, her hands looked up, she became nervous, become confused, but there was no total loss of consciousness, she has been complying with her lamotrigine XL 100 mg 3 tablets every night, this is only medication she is taking.  UPDATE 10/25/2018CM [name], 32-year-old female returns for follow-up with history of seizure disorder and anxiety. She denies any seizure events in the last year. Sleep deprived EEG was normal . Lamictal level 8.1 after last visit which is therapeutic.  She denies side effects to Lamictal. She is currently taking 300 mg daily. She was going to a psychotherapist for her anxiety disorder but stopped going when she was told she was bipolar.She returns for reevaluation. She denies any interval medical issues. She does not exercise  REVIEW OF SYSTEMS: Full 14 system review of systems performed and notable only for those listed, all others are neg:  Constitutional: neg  Cardiovascular: neg Ear/Nose/Throat: neg  Skin: neg Eyes: neg Respiratory: neg Gastroitestinal: neg  Hematology/Lymphatic: neg  Endocrine: neg Musculoskeletal:neg Allergy/Immunology: neg Neurological: seizure disorder Psychiatric: anxiety Sleep : neg   ALLERGIES: No Known Allergies  HOME MEDICATIONS: Outpatient Medications Prior to Visit  Medication Sig Dispense Refill  . fluconazole (DIFLUCAN) 200 MG tablet Take 1 tablet (200 mg total) by mouth once as needed for up to 2 doses (if yeast infection symptoms develop). 7 tablet 0  . ibuprofen (ADVIL,MOTRIN) 800 MG tablet Take 1 tablet (800 mg total) by mouth every 8 (eight) hours as needed. 21 tablet 0  . LamoTRIgine 100 MG TB24 Take 3 tablets (300 mg total) by mouth daily. 270 tablet 4  . penicillin v potassium (VEETID) 500 MG tablet Take 1 tablet by mouth every 6 (six) hours.  0   No facility-administered medications prior to visit.     PAST MEDICAL HISTORY: Past Medical History:  Diagnosis Date  . Anxiety   . Depression   . Seizures (HCC)     PAST SURGICAL HISTORY: Past Surgical History:  Procedure Laterality Date  . CESAREAN SECTION      FAMILY HISTORY: Family History  Problem Relation Age of Onset  . Diabetes Father     SOCIAL HISTORY:   Social History   Socioeconomic History  . Marital status: Married    Spouse name: Not on file  . Number of children: 2  . Years of education: Not on file  . Highest education level: Not on file  Occupational History  . Not on file  Social Needs  . Financial  resource strain: Not on file  . Food insecurity:    Worry: Not on file    Inability: Not on file  . Transportation needs:    Medical: Not on file    Non-medical: Not on file  Tobacco Use  . Smoking status: Current Every Day Smoker    Packs/day: 1.00    Types: Cigarettes  . Smokeless tobacco: Never Used  Substance and Sexual Activity  . Alcohol use: Yes    Comment: occasionally   . Drug use: Yes    Types: Marijuana    Comment: last use yesterday  . Sexual activity: Yes    Birth control/protection: None  Lifestyle  . Physical activity:    Days per week: Not on file    Minutes per session: Not on file  . Stress: Not on file  Relationships  . Social connections:    Talks on phone: Not on file    Gets together: Not on file    Attends religious service: Not on file    Active member of club or organization: Not on file    Attends meetings of clubs or organizations: Not on file    Relationship status: Not on file  . Intimate partner violence:    Fear of current or ex partner: Not on file    Emotionally abused: Not on file    Physically abused: Not on file    Forced sexual activity: Not on file  Other Topics Concern  . Not on file  Social History Narrative  . Not on file     PHYSICAL EXAM  There were no vitals filed for this visit. There is no height or weight on file to calculate BMI.  Generalized: Well developed, in no acute distress  Head: normocephalic and atraumatic,. Oropharynx benign  Neck: Supple,  Musculoskeletal: No deformity   Neurological examination   Mentation: Alert oriented to time, place, history taking. Attention span and concentration appropriate. Recent and remote memory intact.  Follows all commands speech and language fluent.   Cranial nerve II-XII: Fundoscopic exam reveals sharp disc margins.Pupils were equal round reactive to light extraocular movements were full, visual field were full on confrontational test. Facial sensation and strength  were normal. hearing was intact to finger rubbing bilaterally. Uvula tongue midline. head turning and shoulder shrug were normal and symmetric.Tongue protrusion into cheek strength was normal. Motor: normal bulk and tone, full strength in the BUE, BLE, fine finger movements normal, no pronator drift. No focal weakness Sensory: normal and symmetric to light touch, on the face arms and legs Coordination: finger-nose-finger, heel-to-shin bilaterally, no dysmetria Reflexes: Brachioradialis 2/2, biceps 2/2, triceps 2/2, patellar 2/2, Achilles 2/2, plantar responses were flexor bilaterally. Gait and Station: Rising up from seated position without assistance, normal stance,  moderate stride, good arm swing, smooth turning, able to perform tiptoe, and heel walking without difficulty. Tandem gait is steady  DIAGNOSTIC DATA (LABS, IMAGING, TESTING) - I reviewed patient records, labs, notes, testing and imaging myself where available.  Lab Results  Component Value Date   WBC 7.5 09/05/2018   HGB 12.6 09/05/2018   HCT 38.1 09/05/2018   MCV 96.7 09/05/2018   PLT   388 09/05/2018      Component Value Date/Time   NA 140 09/05/2018 1112   K 3.8 09/05/2018 1112   CL 106 09/05/2018 1112   CO2 26 09/05/2018 1112   GLUCOSE 90 09/05/2018 1112   BUN 5 (L) 09/05/2018 1112   CREATININE 0.66 09/05/2018 1112   CALCIUM 9.1 09/05/2018 1112   PROT 6.8 09/05/2018 1112   ALBUMIN 3.8 09/05/2018 1112   AST 16 09/05/2018 1112   ALT 16 09/05/2018 1112   ALKPHOS 66 09/05/2018 1112   BILITOT 0.5 09/05/2018 1112   GFRNONAA >60 09/05/2018 1112   GFRAA >60 09/05/2018 1112    ASSESSMENT AND PLAN  32 y.o. year old female  has a past medical history of Anxiety, Depression, and Seizures (HCC). here to follow up for seizure disorder. Sleep deprived EEG after her last visit was normal no evidence of myoclonic jerking. Lamictal level 8.1 which is therapeutic  PLAN: Continue lamotrigineSR 100 mg 3 tabs every night will  refill Sleep deprived EEG was normal no evidence of myoclonic jerks during sleep Last Lamictal level 8.1 which his therapeutic, continue same dose Call for any seizure activity Follow-up yearly and when necessary I spent 15 minutes in total face to face time with the patient more than 50% of which was spent counseling and coordination of care, reviewing test results reviewing medications and discussing and reviewing the diagnosis of seizure disorder and anxiety disorder Nancy Carolyn Martin, GNP, BC, APRN  [facility] [address], [address] , [place] [address] [phone] 

## 2018-11-19 NOTE — Progress Notes (Signed)
[facility]  PATIENT: Debbie Schwartz DOB: 03/17/1986   REASON FOR VISIT: follow up for seizure disorder HISTORY FROM: patient and husband    HISTORY OF PRESENT ILLNESS:Debbie Schwartz is [age] years old right-handed Caucasian female follow-up for seizure,  She had seizures since 2010, had first seizure was in October 15 2009, generalized tonic-clonic seizure, CAT scan was essentially normal, MRI of the brain, and EEG was normal as well.  She had recurrent seizure on November 08 2009, was loaded with Keppra at emergency room, later due to her complains of mood disorder, she was switched to Lamictal, currently taking lamotrigin 100 mg 3 tablets every night, tolerating well,  She went through normal pregnancy twice, while taking lamotrigine, reported last seizure was in 2013.  UPDATE Aug 26 2015: She has no recurrent seizure, was taken to the emergency room October 2015 for an anxiety episode, happened during her psychotherapy section, with hyperventilation, bilateral hands numbness, posturing,  She tolerating lamotrigine xr 100 mg 3 tablets a day, also taking Latuda, her depression is under better control,  Today she complains of excessive fatigue, daytime sleepiness, ESS 10, FSS 43. She also has frequent awakening at night time, frequent body jerking movement.   UPDATE Oct 25th 2017:YY She had one recurrent episode of seizure-like activity during her psychotherapy section for her anxiety disorder, her hands looked up, she became nervous, become confused, but there was no total loss of consciousness, she has been complying with her lamotrigine XL 100 mg 3 tablets every night, this is only medication she is taking.  UPDATE 10/25/2018CM Ms. Debbie Schwartz, 32 year old female returns for follow-up with history of seizure disorder and anxiety. She denies any seizure events in the last year. Sleep deprived EEG was normal . Lamictal level 8.1 after last visit which is  therapeutic. She denies side effects to Lamictal. She is currently taking 300 mg daily. She was going to a psychotherapist for her anxiety disorder but stopped going when she was told she was bipolar.She returns for reevaluation. She denies any interval medical issues. She does not exercise UPDATE 12/18/2019CM Ms. Debbie Schwartz, 32 year old female returns for follow-up with history of seizure disorder and anxiety.  She denies any seizure events in the last 2 to 3 years.  Sleep deprived EEG in the past was normal she is currently on Lamictal 300 mg daily without side effects.  She stopped going to her psychotherapist for her anxiety disorder when he told her she was bipolar.  She denies any interval medical issues.  She does not exercise.  She returns for reevaluation.  She needs refills on her medication. REVIEW OF SYSTEMS: Full 14 system review of systems performed and notable only for those listed, all others are neg:  Constitutional: neg  Cardiovascular: neg Ear/Nose/Throat: neg  Skin: neg Eyes: neg Respiratory: neg Gastroitestinal: neg  Hematology/Lymphatic: neg  Endocrine: neg Musculoskeletal:neg Allergy/Immunology: neg Neurological: seizure disorder Psychiatric: anxiety Sleep : neg   ALLERGIES: No Known Allergies  HOME MEDICATIONS: Outpatient Medications Prior to Visit  Medication Sig Dispense Refill  . LamoTRIgine 100 MG TB24 Take 3 tablets (300 mg total) by mouth daily. 270 tablet 4  . fluconazole (DIFLUCAN) 200 MG tablet Take 1 tablet (200 mg total) by mouth once as needed for up to 2 doses (if yeast infection symptoms develop). 7 tablet 0  . ibuprofen (ADVIL,MOTRIN) 800 MG tablet Take 1 tablet (800 mg total) by mouth every 8 (eight) hours as needed. 21 tablet 0  . penicillin v  potassium (VEETID) 500 MG tablet Take 1 tablet by mouth every 6 (six) hours.  0   No facility-administered medications prior to visit.     PAST MEDICAL HISTORY: Past Medical History:  Diagnosis Date  .  Anxiety   . Depression   . Seizures (HCC)     PAST SURGICAL HISTORY: Past Surgical History:  Procedure Laterality Date  . CESAREAN SECTION      FAMILY HISTORY: Family History  Problem Relation Age of Onset  . Diabetes Father     SOCIAL HISTORY: Social History   Socioeconomic History  . Marital status: Married    Spouse name: Not on file  . Number of children: 2  . Years of education: Not on file  . Highest education level: Not on file  Occupational History  . Not on file  Social Needs  . Financial resource strain: Not on file  . Food insecurity:    Worry: Not on file    Inability: Not on file  . Transportation needs:    Medical: Not on file    Non-medical: Not on file  Tobacco Use  . Smoking status: Current Every Day Smoker    Packs/day: 0.50    Types: Cigarettes  . Smokeless tobacco: Never Used  Substance and Sexual Activity  . Alcohol use: Yes    Comment: occasionally   . Drug use: Yes    Types: Marijuana    Comment: last use yesterday  . Sexual activity: Yes    Birth control/protection: None  Lifestyle  . Physical activity:    Days per week: Not on file    Minutes per session: Not on file  . Stress: Not on file  Relationships  . Social connections:    Talks on phone: Not on file    Gets together: Not on file    Attends religious service: Not on file    Active member of club or organization: Not on file    Attends meetings of clubs or organizations: Not on file    Relationship status: Not on file  . Intimate partner violence:    Fear of current or ex partner: Not on file    Emotionally abused: Not on file    Physically abused: Not on file    Forced sexual activity: Not on file  Other Topics Concern  . Not on file  Social History Narrative  . Not on file     PHYSICAL EXAM  Vitals:   11/20/18 1032  BP: 104/67  Pulse: 91  Weight: 145 lb 3.2 oz (65.9 kg)  Height: 5\' 4"  (1.626 m)   Body mass index is 24.92 kg/m.  Generalized: Well  developed, in no acute distress  Head: normocephalic and atraumatic,. Oropharynx benign  Neck: Supple,  Musculoskeletal: No deformity   Neurological examination   Mentation: Alert oriented to time, place, history taking. Attention span and concentration appropriate. Recent and remote memory intact.  Follows all commands speech and language fluent.   Cranial nerve II-XII: Pupils were equal round reactive to light extraocular movements were full, visual field were full on confrontational test. Facial sensation and strength were normal. hearing was intact to finger rubbing bilaterally. Uvula tongue midline. head turning and shoulder shrug were normal and symmetric.Tongue protrusion into cheek strength was normal. Motor: normal bulk and tone, full strength in the BUE, BLE,  Sensory: normal and symmetric to light touch, on the face arms and legs Coordination: finger-nose-finger, heel-to-shin bilaterally, no dysmetria Reflexes: Brachioradialis 2/2, biceps 2/2, triceps  2/2, patellar 2/2, Achilles 2/2, plantar responses were flexor bilaterally. Gait and Station: Rising up from seated position without assistance, normal stance,  moderate stride, good arm swing, smooth turning, able to perform tiptoe, and heel walking without difficulty. Tandem gait is steady  DIAGNOSTIC DATA (LABS, IMAGING, TESTING) - I reviewed patient records, labs, notes, testing and imaging myself where available.  Lab Results  Component Value Date   WBC 7.5 09/05/2018   HGB 12.6 09/05/2018   HCT 38.1 09/05/2018   MCV 96.7 09/05/2018   PLT 388 09/05/2018      Component Value Date/Time   NA 140 09/05/2018 1112   K 3.8 09/05/2018 1112   CL 106 09/05/2018 1112   CO2 26 09/05/2018 1112   GLUCOSE 90 09/05/2018 1112   BUN 5 (L) 09/05/2018 1112   CREATININE 0.66 09/05/2018 1112   CALCIUM 9.1 09/05/2018 1112   PROT 6.8 09/05/2018 1112   ALBUMIN 3.8 09/05/2018 1112   AST 16 09/05/2018 1112   ALT 16 09/05/2018 1112    ALKPHOS 66 09/05/2018 1112   BILITOT 0.5 09/05/2018 1112   GFRNONAA >60 09/05/2018 1112   GFRAA >60 09/05/2018 1112    ASSESSMENT AND PLAN  32 y.o. year old female  has a past medical history of Anxiety, Depression, and Seizures (HCC). here to follow up for seizure disorder. Sleep deprived EEG in the past  was normal no evidence of myoclonic jerking.Last  Lamictal level 8.1 which is therapeutic  PLAN: Continue lamotrigineSR 100 mg 3 tabs every night will refill Call for any seizure activity Follow-up yearly and when necessary I spent 15 minutes in total face to face time with the patient more than 50% of which was spent counseling and coordination of care, reviewing test results reviewing medications and discussing and reviewing the diagnosis of seizure disorder.  Nilda Riggs, [facility], [facility], APRN  [facility] [address], [address] [place], [place] [address] [phone]

## 2018-11-20 ENCOUNTER — Ambulatory Visit: Payer: Medicaid Other | Admitting: Nurse Practitioner

## 2018-11-20 ENCOUNTER — Other Ambulatory Visit: Payer: Self-pay | Admitting: Neurology

## 2018-11-20 ENCOUNTER — Encounter: Payer: Self-pay | Admitting: Nurse Practitioner

## 2018-11-20 VITALS — BP 104/67 | HR 91 | Ht 64.0 in | Wt 145.2 lb

## 2018-11-20 DIAGNOSIS — R569 Unspecified convulsions: Secondary | ICD-10-CM

## 2018-11-20 MED ORDER — LAMOTRIGINE ER 100 MG PO TB24: 300.0000 mg | ORAL_TABLET | Freq: Every day | ORAL | 3 refills | Status: DC

## 2018-11-20 NOTE — Patient Instructions (Signed)
Continue lamotrigineSR 100 mg 3 tabs every night will refill Call for any seizure activity Follow-up yearly and when necessary

## 2018-11-21 ENCOUNTER — Telehealth: Payer: Self-pay | Admitting: *Deleted

## 2018-11-21 NOTE — Telephone Encounter (Signed)
Received PA request for lamotrigine ER. Patient was unable to tolerate keppra which caused a mood disorder. Called [facility], spoke with Toniann FailWendy and answered clinical questions. Approval [id number] x 1 year. Interaction ID [id number]

## 2018-11-21 NOTE — Progress Notes (Signed)
I have reviewed and agreed above plan. 

## 2019-11-12 ENCOUNTER — Other Ambulatory Visit: Payer: Self-pay

## 2019-11-12 ENCOUNTER — Encounter: Payer: Self-pay | Admitting: Neurology

## 2019-11-12 ENCOUNTER — Ambulatory Visit: Payer: Medicaid Other | Admitting: Neurology

## 2019-11-12 VITALS — BP 106/71 | HR 108 | Temp 97.8°F | Ht 64.0 in | Wt 149.2 lb

## 2019-11-12 DIAGNOSIS — R569 Unspecified convulsions: Secondary | ICD-10-CM | POA: Diagnosis not present

## 2019-11-12 MED ORDER — LAMOTRIGINE ER 100 MG PO TB24: 300.0000 mg | ORAL_TABLET | Freq: Every day | ORAL | 3 refills | Status: DC

## 2019-11-12 NOTE — Progress Notes (Signed)
PATIENT: Debbie Schwartz DOB: 04/12/1986  REASON FOR VISIT: follow up HISTORY FROM: patient  HISTORY OF PRESENT ILLNESS: Today 11/12/19  HISTORY HISTORY OF PRESENT ILLNESS:Debbie L Wilsonis 33 years old right-handed Caucasian female follow-up for seizure,  She hadseizures since 2010, had first seizure wasin October 15 2009, generalized tonic-clonic seizure, CAT scan was essentially normal, MRI of the brain, and EEG was normal as well.  She had recurrent seizureonDecember 6 2010, was loaded with Keppra at emergency room, later due to her complains of mood disorder, she was switched to Lamictal, currently taking lamotrigin 100 mg 3 tablets every night, tolerating well,  She went through normal pregnancy twice, while taking lamotrigine, reported last seizure was in 2013.  UPDATE Aug 26 2015: She has no recurrent seizure, was taken to the emergency room October 2015 for an anxiety episode, happened during her psychotherapy section, with hyperventilation, bilateral hands numbness, posturing,  She tolerating lamotrigine xr 100 mg 3 tablets a day, also taking Latuda, her depression is under better control,  Today she complains of excessive fatigue, daytime sleepiness, ESS 10, FSS 43. She also has frequent awakening at night time, frequent body jerking movement.   UPDATE Oct 25th 2017:YY She had one recurrent episode of seizure-like activity during her psychotherapy section for her anxiety disorder, her hands looked up, she became nervous, become confused, but there was no total loss of consciousness, she has been complying with her lamotrigine XL 100 mg 3 tablets every night, this is only medication she is taking.  UPDATE 10/25/2018CM Ms. Debbie Schwartz, 33 year old female returns for follow-up with history of seizure disorder and anxiety. She denies any seizure events in the last year. Sleep deprived EEG was normal . Lamictal level 8.1 after last visit which is therapeutic. She  denies side effects to Lamictal. She is currently taking 300 mg daily. She was going to a psychotherapist for her anxiety disorder but stopped going when she was told she was bipolar.She returns for reevaluation. She denies any interval medical issues. She does not exercise UPDATE 12/18/2019CM Ms. Debbie Schwartz, 33 year old female returns for follow-up with history of seizure disorder and anxiety.  She denies any seizure events in the last 2 to 3 years.  Sleep deprived EEG in the past was normal she is currently on Lamictal 300 mg daily without side effects.  She stopped going to her psychotherapist for her anxiety disorder when he told her she was bipolar.  She denies any interval medical issues.  She does not exercise.  She returns for reevaluation.  She needs refills on her medication.   Update November 12, 2019 SS: Ms. Debbie Schwartz is a 33 year old female with history of seizure disorder and anxiety.  She remains on lamotrigine XR 300 mg daily.  She denies recurrent seizure.  She is tolerating medication without side effect.  She reports continued anxiety.  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  She presents today for evaluation accompanied by her daughter.  REVIEW OF SYSTEMS: Out of a complete 14 system review of symptoms, the patient complains only of the following symptoms, and all other reviewed systems are negative.  Seizures   ALLERGIES: No Known Allergies  HOME MEDICATIONS: Outpatient Medications Prior to Visit  Medication Sig Dispense Refill  . LamoTRIgine 100 MG TB24 24 hour tablet Take 3 tablets (300 mg total) by mouth daily. 270 tablet 3   No facility-administered medications prior to visit.     PAST MEDICAL HISTORY: Past Medical History:  Diagnosis  Date  . Anxiety   . Depression   . Seizures (HCC)     PAST SURGICAL HISTORY: Past Surgical History:  Procedure Laterality Date  . CESAREAN SECTION      FAMILY HISTORY: Family History  Problem Relation Age of  Onset  . Diabetes Father     SOCIAL HISTORY: Social History   Socioeconomic History  . Marital status: Married    Spouse name: Not on file  . Number of children: 2  . Years of education: Not on file  . Highest education level: Not on file  Occupational History  . Not on file  Social Needs  . Financial resource strain: Not on file  . Food insecurity    Worry: Not on file    Inability: Not on file  . Transportation needs    Medical: Not on file    Non-medical: Not on file  Tobacco Use  . Smoking status: Current Every Day Smoker    Packs/day: 0.50    Types: Cigarettes  . Smokeless tobacco: Never Used  Substance and Sexual Activity  . Alcohol use: Yes    Comment: occasionally   . Drug use: Yes    Types: Marijuana    Comment: last use yesterday  . Sexual activity: Yes    Birth control/protection: None  Lifestyle  . Physical activity    Days per week: Not on file    Minutes per session: Not on file  . Stress: Not on file  Relationships  . Social Musician on phone: Not on file    Gets together: Not on file    Attends religious service: Not on file    Active member of club or organization: Not on file    Attends meetings of clubs or organizations: Not on file    Relationship status: Not on file  . Intimate partner violence    Fear of current or ex partner: Not on file    Emotionally abused: Not on file    Physically abused: Not on file    Forced sexual activity: Not on file  Other Topics Concern  . Not on file  Social History Narrative  . Not on file      PHYSICAL EXAM  Vitals:   11/12/19 1341  BP: 106/71  Pulse: (!) 108  Temp: 97.8 F (36.6 C)  Weight: 149 lb 3.2 oz (67.7 kg)  Height: 5\' 4"  (1.626 m)   Body mass index is 25.61 kg/m.  Generalized: Well developed, in no acute distress   Neurological examination  Mentation: Alert oriented to time, place, history taking. Follows all commands speech and language fluent Cranial nerve  II-XII: Pupils were equal round reactive to light. Extraocular movements were full, visual field were full on confrontational test. Facial sensation and strength were normal.  Head turning and shoulder shrug  were normal and symmetric. Motor: The motor testing reveals 5 over 5 strength of all 4 extremities. Good symmetric motor tone is noted throughout.  Sensory: Sensory testing is intact to soft touch on all 4 extremities. No evidence of extinction is noted.  Coordination: Cerebellar testing reveals good finger-nose-finger and heel-to-shin bilaterally.  Gait and station: Gait is normal. Tandem gait is normal. Romberg is negative. No drift is seen.  Reflexes: Deep tendon reflexes are symmetric and normal bilaterally.   DIAGNOSTIC DATA (LABS, IMAGING, TESTING) - I reviewed patient records, labs, notes, testing and imaging myself where available.  Lab Results  Component Value Date   WBC  7.5 09/05/2018   HGB 12.6 09/05/2018   HCT 38.1 09/05/2018   MCV 96.7 09/05/2018   PLT 388 09/05/2018      Component Value Date/Time   NA 140 09/05/2018 1112   K 3.8 09/05/2018 1112   CL 106 09/05/2018 1112   CO2 26 09/05/2018 1112   GLUCOSE 90 09/05/2018 1112   BUN 5 (L) 09/05/2018 1112   CREATININE 0.66 09/05/2018 1112   CALCIUM 9.1 09/05/2018 1112   PROT 6.8 09/05/2018 1112   ALBUMIN 3.8 09/05/2018 1112   AST 16 09/05/2018 1112   ALT 16 09/05/2018 1112   ALKPHOS 66 09/05/2018 1112   BILITOT 0.5 09/05/2018 1112   GFRNONAA >60 09/05/2018 1112   GFRAA >60 09/05/2018 1112   No results found for: CHOL, HDL, LDLCALC, LDLDIRECT, TRIG, CHOLHDL No results found for: HGBA1C No results found for: VITAMINB12 No results found for: TSH  ASSESSMENT AND PLAN 33 y.o. year old female  has a past medical history of Anxiety, Depression, and Seizures ([place]). here with:  1.  Seizures -No recurrent seizure -Continue lamotrigine XR 100 mg tablet, 3 tablets at bedtime -I will check routine lab work today -She  will call for recurrent seizure, follow-up in 1 year or sooner if needed  I spent 15 minutes with the patient. 50% of this time was spent discussing her plan of care.  Butler Denmark, AGNP-C, DNP 11/12/2019, 1:54 PM [facility] [address] [place], [place] [address] [phone]

## 2019-11-12 NOTE — Patient Instructions (Signed)
Continue current dose of Lamictal   I will check lab work today  Call for recurrent seizure activity   Follow-up in 1 year or sooner if needed

## 2019-11-13 LAB — COMPREHENSIVE METABOLIC PANEL
ALT: 15 IU/L (ref 0–32)
AST: 15 IU/L (ref 0–40)
Albumin/Globulin Ratio: 1.8 (ref 1.2–2.2)
Albumin: 4.4 g/dL (ref 3.8–4.8)
Alkaline Phosphatase: 78 IU/L (ref 39–117)
BUN/Creatinine Ratio: 7 — ABNORMAL LOW (ref 9–23)
BUN: 5 mg/dL — ABNORMAL LOW (ref 6–20)
Bilirubin Total: <0.2 mg/dL (ref 0.0–1.2)
CO2: 24 mmol/L (ref 20–29)
Calcium: 10 mg/dL (ref 8.7–10.2)
Chloride: 104 mmol/L (ref 96–106)
Creatinine, Ser: 0.73 mg/dL (ref 0.57–1.00)
GFR calc Af Amer: 126 mL/min/{1.73_m2} (ref >59)
GFR calc non Af Amer: 109 mL/min/{1.73_m2} (ref >59)
Globulin, Total: 2.4 g/dL (ref 1.5–4.5)
Glucose: 72 mg/dL (ref 65–99)
Potassium: 4.4 mmol/L (ref 3.5–5.2)
Sodium: 141 mmol/L (ref 134–144)
Total Protein: 6.8 g/dL (ref 6.0–8.5)

## 2019-11-13 LAB — CBC WITH DIFFERENTIAL/PLATELET
Basophils Absolute: 0 10*3/uL (ref 0.0–0.2)
Basos: 0 %
EOS (ABSOLUTE): 0.1 10*3/uL (ref 0.0–0.4)
Eos: 1 %
Hematocrit: 39.6 % (ref 34.0–46.6)
Hemoglobin: 13.9 g/dL (ref 11.1–15.9)
Immature Grans (Abs): 0 10*3/uL (ref 0.0–0.1)
Immature Granulocytes: 0 %
Lymphocytes Absolute: 1.6 10*3/uL (ref 0.7–3.1)
Lymphs: 28 %
MCH: 32.6 pg (ref 26.6–33.0)
MCHC: 35.1 g/dL (ref 31.5–35.7)
MCV: 93 fL (ref 79–97)
Monocytes Absolute: 0.4 10*3/uL (ref 0.1–0.9)
Monocytes: 7 %
Neutrophils Absolute: 3.6 10*3/uL (ref 1.4–7.0)
Neutrophils: 64 %
Platelets: 396 10*3/uL (ref 150–450)
RBC: 4.27 x10E6/uL (ref 3.77–5.28)
RDW: 12 % (ref 11.7–15.4)
WBC: 5.6 10*3/uL (ref 3.4–10.8)

## 2019-11-13 LAB — LAMOTRIGINE LEVEL: Lamotrigine Lvl: 7.8 ug/mL (ref 2.0–20.0)

## 2019-11-17 ENCOUNTER — Telehealth: Payer: Self-pay

## 2019-11-17 NOTE — Telephone Encounter (Signed)
Called and went over pts recent labs per NP Butler Denmark. Pt demonstrated understanding and had no questions.  "   Please call the patient, labs are stable. Good level of Lamictal."- NP Butler Denmark.

## 2019-11-21 ENCOUNTER — Ambulatory Visit: Payer: Medicaid Other | Admitting: Neurology

## 2020-02-04 NOTE — Progress Notes (Signed)
I have reviewed and agreed above plan. 

## 2020-06-17 ENCOUNTER — Telehealth: Payer: Self-pay | Admitting: Neurology

## 2020-06-17 NOTE — Telephone Encounter (Signed)
PA received from pharmacy. PA was started on [email or web address]. Key is [id number]. No timeframe given for the approval. Will continue to look out for determination.

## 2020-06-17 NOTE — Telephone Encounter (Signed)
PA request for 90 day supply was denied but the PA request for 30 day supply was approved. Effective dates 06/17/2020-06/16/2021.   Left message for patient to call back so she is aware that she will not be able to get a 3 month supply.   Pharmacy is aware of approval for 30 day supply instead of 90 day supply. Will leave encounter open in case she calls back.

## 2020-06-23 ENCOUNTER — Other Ambulatory Visit: Payer: Self-pay | Admitting: Neurology

## 2020-06-29 ENCOUNTER — Other Ambulatory Visit: Payer: Self-pay

## 2020-06-29 MED ORDER — LAMOTRIGINE ER 100 MG PO TB24: 300.0000 mg | ORAL_TABLET | Freq: Every day | ORAL | 3 refills | Status: DC

## 2020-06-29 NOTE — Telephone Encounter (Signed)
Patient's husband called back. I advised him that PA was approved for a 30 day supply each month. He also asked for a refill to reflect the change to be sent to [facility]. Advised him that I would send in a refill to last until her appointment in December. He verbalized understanding.   Nothing further needed at time of call.

## 2020-07-17 IMAGING — CT CT NECK W/ CM
4 series · 14 of 33 positions shown, 17 images · IV contrast (omnipaque)
Comparison: None similar

CLINICAL DATA: Right dental abscess with maxillofacial swelling.

EXAM:
CT NECK WITH CONTRAST
TECHNIQUE: Multidetector CT imaging of the neck was performed using the
standard protocol following the bolus administration of intravenous
contrast.
CONTRAST:  75mL OMNIPAQUE IOHEXOL 300 MG/ML  SOLN

[Series 2: axial neck · axial · 0.56mm/px · z∈[-134,+10]mm · 5 of 108 slices shown, 7 images]
[im 18/108  soft-tissue]
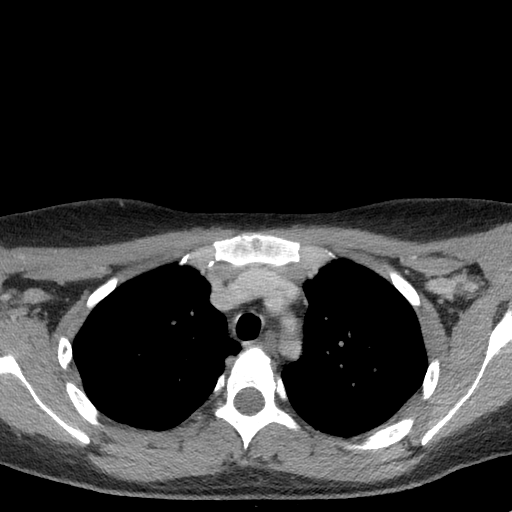
[im 18/108  bone]
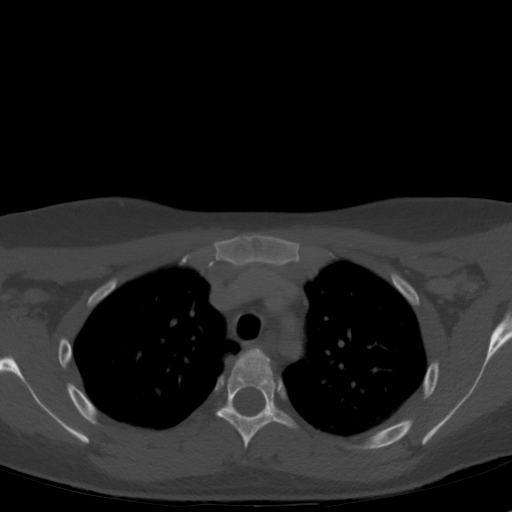
[im 36/108  bone]
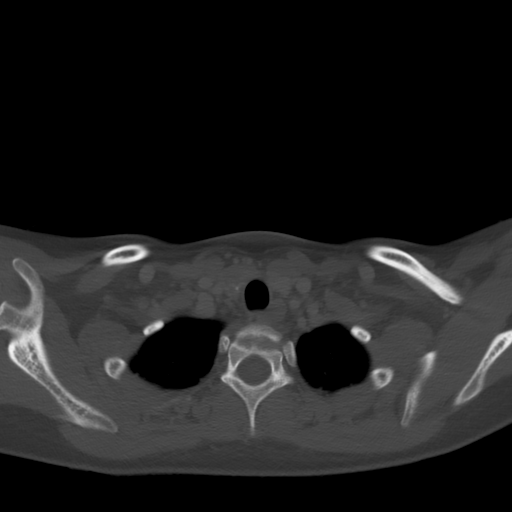
[im 54/108  bone]
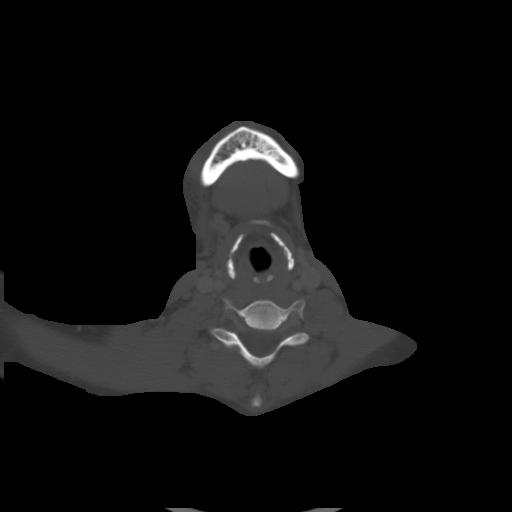
[im 72/108  bone]
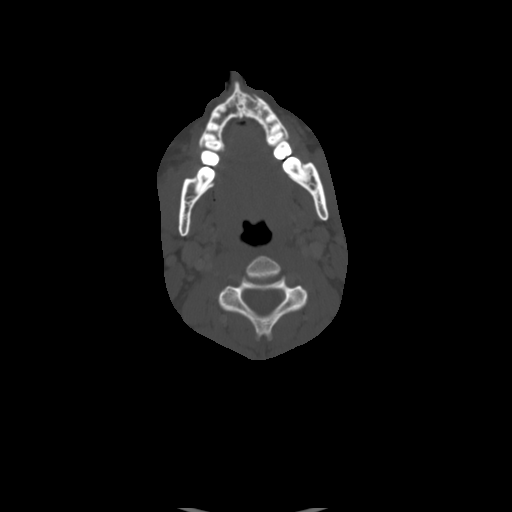
[im 90/108  soft-tissue]
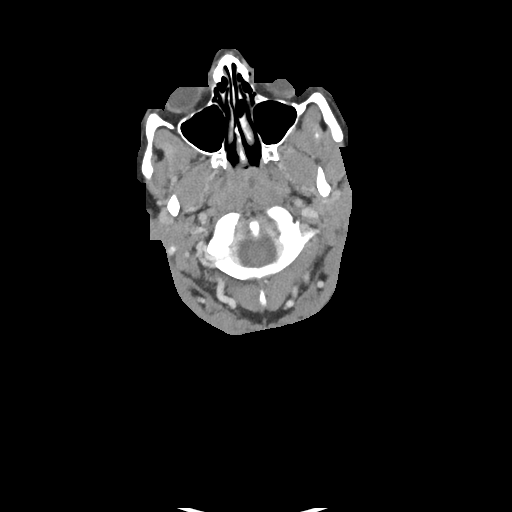
[im 90/108  bone]
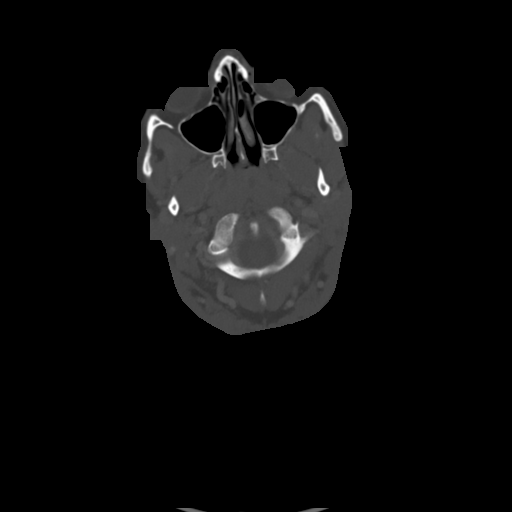

[Series 6: coronal neck · coronal · 0.47mm/px · 3 of 128 slices shown]
[im 36/128  bone]
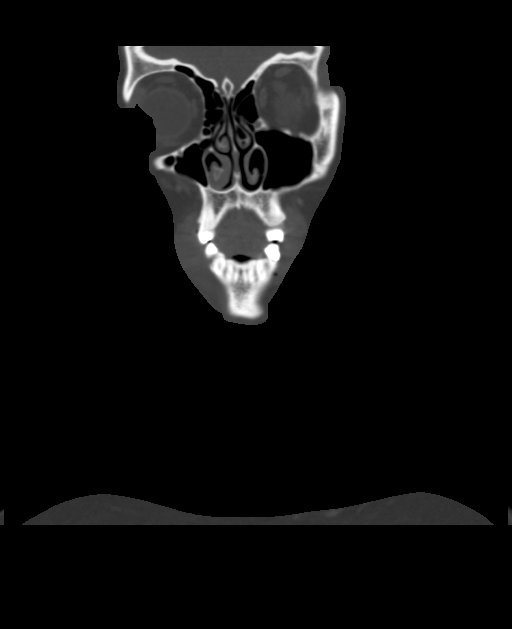
[im 55/128  bone]
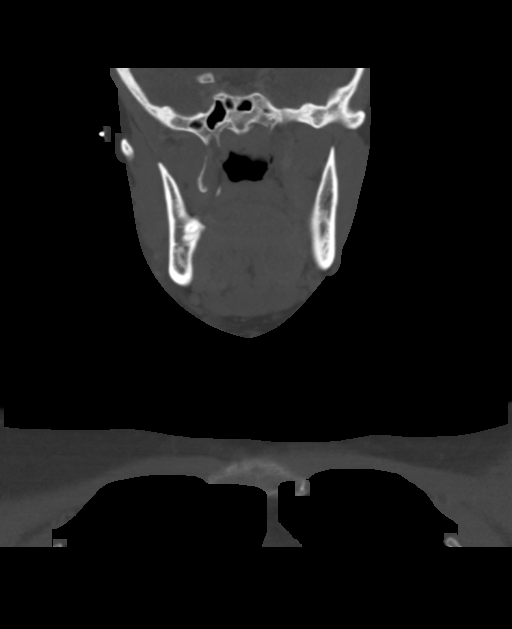
[im 74/128  bone]
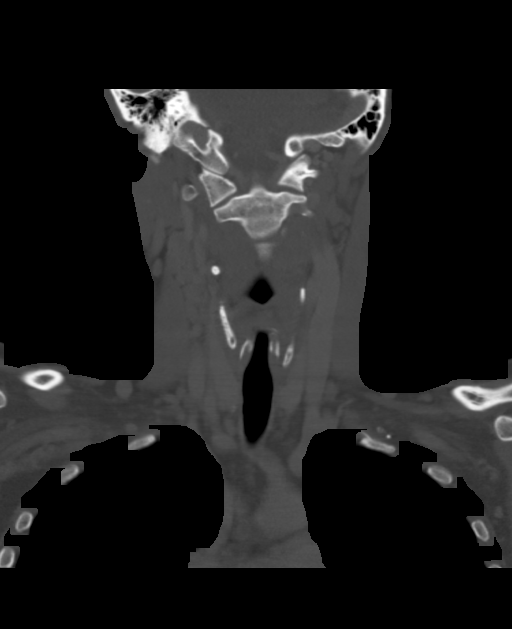

[Series 7: sagittal neck · sagittal · 0.48mm/px · 5 of 101 slices shown, 6 images]
[im 34/101  bone]
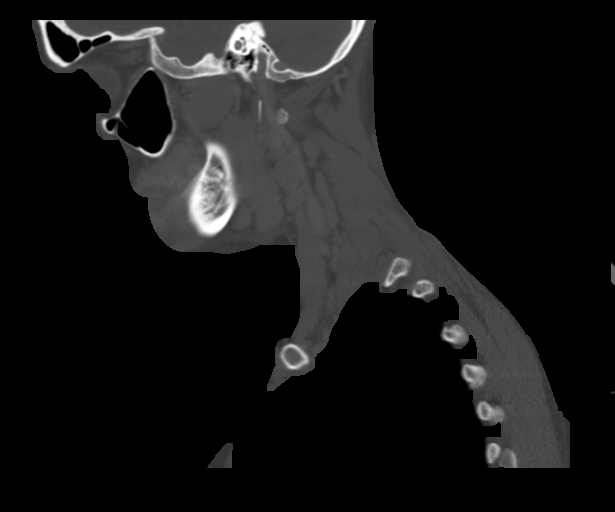
[im 42/101  bone]
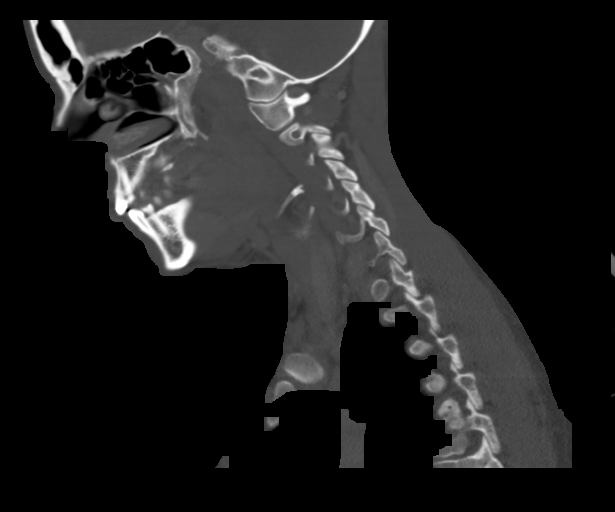
[im 51/101  soft-tissue]
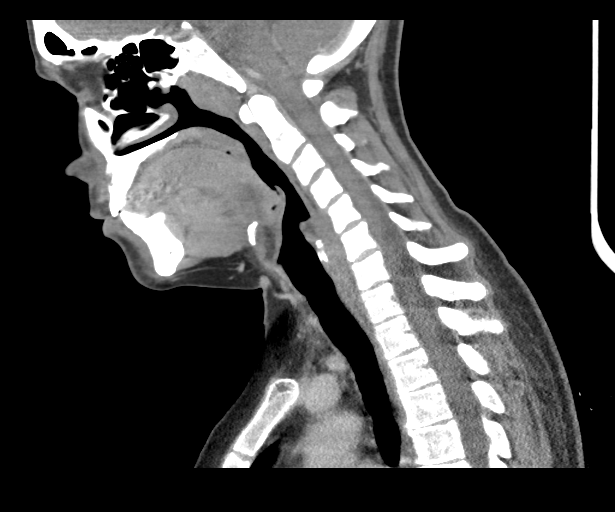
[im 51/101  bone]
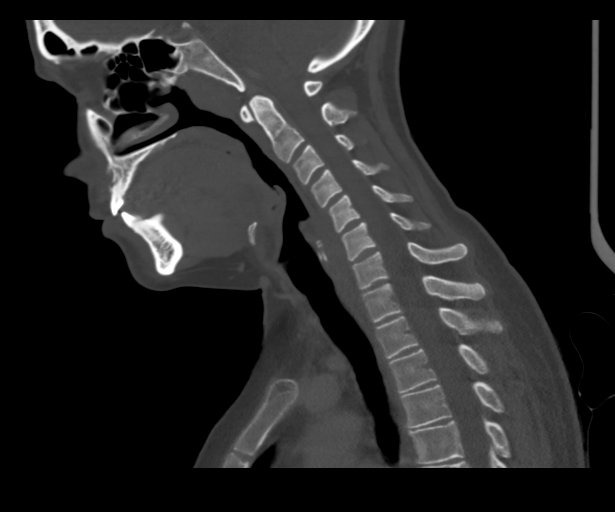
[im 59/101  bone]
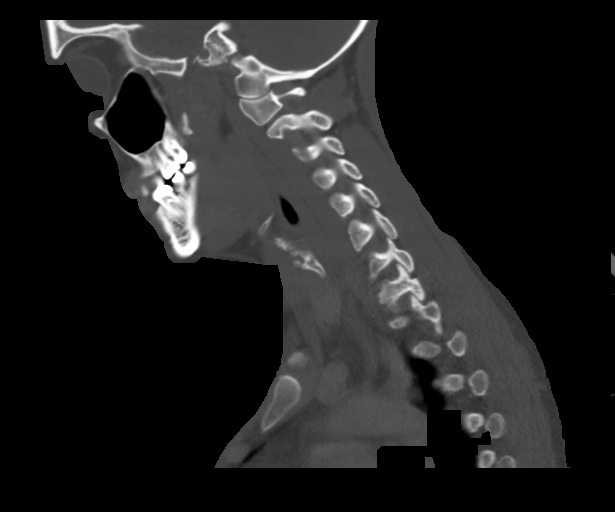
[im 67/101  bone]
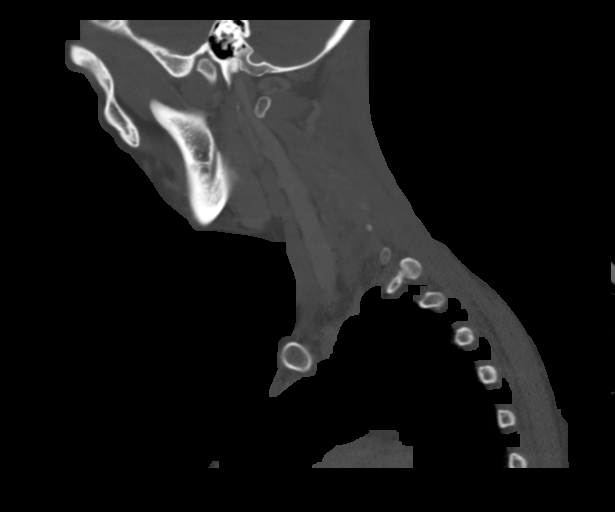

[Series 8: orthogonal ax · axial · 0.39mm/px · 1 of 109 slices shown]
[im 19/109  bone]
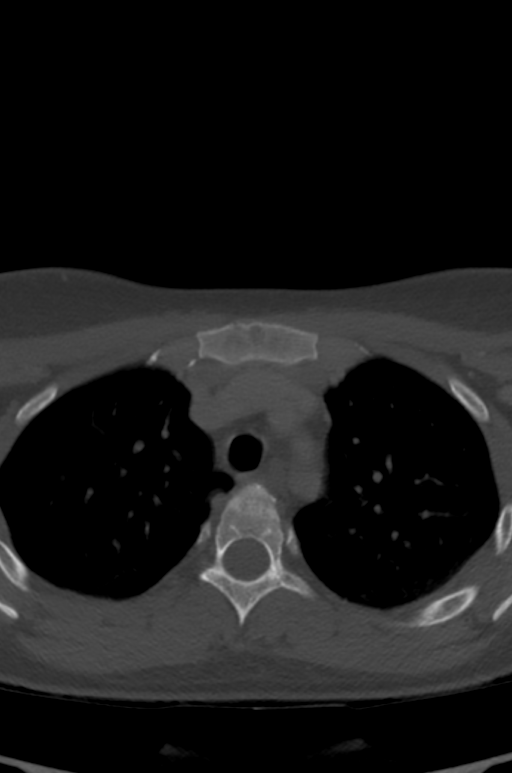

[14 of 33 positions shown; findings below may reference images not displayed]

FINDINGS: Pharynx and larynx: Chronic adenoid thickening with a small left
para median retention cyst.

Soft tissue stranding around the right posterior mandible where
there is a 7 x 2 mm subperiosteal collection along the buccal
surface. This appears to arise from tooth 30 which is severely
cavitated and has periapical erosion with tiny lateral defect on
coronal reformats.

Salivary glands: No primary inflammation.

Thyroid: Negative

Lymph nodes: Reactive appearing enlargement of submandibular lymph
nodes without cavitation.

Vascular: Negative

Limited intracranial: Negative

Visualized orbits: Negative

Mastoids and visualized paranasal sinuses: Clear

Skeleton: No acute or aggressive finding.

Upper chest: Negative for pneumonia
IMPRESSION: Odontogenic infection from tooth 30 with 7 x 2 mm subperiosteal
abscess along the buccal surface.

## 2020-10-11 ENCOUNTER — Telehealth: Payer: Self-pay | Admitting: Neurology

## 2020-10-11 MED ORDER — LAMOTRIGINE ER 100 MG PO TB24: 300.0000 mg | ORAL_TABLET | Freq: Every day | ORAL | 3 refills | Status: DC

## 2020-10-11 NOTE — Telephone Encounter (Signed)
Blackstock,William(husband) is asking that a fax be sent to the pharmacy for the 90 day script for LamoTRIgine 100 MG TB24 24 hour tablet  to [facility]

## 2020-10-11 NOTE — Addendum Note (Signed)
Addended by: Guy Begin on: 10/11/2020 03:20 PM   Modules accepted: Orders

## 2020-11-16 ENCOUNTER — Ambulatory Visit: Payer: Medicaid Other | Admitting: Neurology

## 2020-11-16 NOTE — Progress Notes (Deleted)
PATIENT: Debbie Schwartz DOB: May 23, 1986  REASON FOR VISIT: follow up HISTORY FROM: patient  HISTORY OF PRESENT ILLNESS: Today 11/16/20  HISTORY HISTORY OF PRESENT ILLNESS:Debbie L Wilsonis 34 years old right-handed Caucasian female follow-up for seizure,  She hadseizures since 2010, had first seizure wasin October 15 2009, generalized tonic-clonic seizure, CAT scan was essentially normal, MRI of the brain, and EEG was normal as well.  She had recurrent seizureonDecember 6 2010, was loaded with Keppra at emergency room, later due to her complains of mood disorder, she was switched to Lamictal, currently taking lamotrigin 100 mg 3 tablets every night, tolerating well,  She went through normal pregnancy twice, while taking lamotrigine, reported last seizure was in 2013.  UPDATE Aug 26 2015: She has no recurrent seizure, was taken to the emergency room October 2015 for an anxiety episode, happened during her psychotherapy section, with hyperventilation, bilateral hands numbness, posturing,  She tolerating lamotrigine xr 100 mg 3 tablets a day, also taking Latuda, her depression is under better control,  Today she complains of excessive fatigue, daytime sleepiness, ESS 10, FSS 43. She also has frequent awakening at night time, frequent body jerking movement.   UPDATE Oct 25th 2017:YY She had one recurrent episode of seizure-like activity during her psychotherapy section for her anxiety disorder, her hands looked up, she became nervous, become confused, but there was no total loss of consciousness, she has been complying with her lamotrigine XL 100 mg 3 tablets every night, this is only medication she is taking.  UPDATE 10/25/2018CMMs. Debbie Schwartz, 34 year old female returns for follow-up with history of seizure disorder and anxiety. She denies any seizure events in the last year. Sleep deprived EEG was normal . Lamictal level 8.1 after last visit which is therapeutic. She  denies side effects to Lamictal. She is currently taking 300 mg daily. She was going to a psychotherapist for her anxiety disorder but stopped going when she was told she was bipolar.She returns for reevaluation. She denies any interval medical issues. She does not exercise UPDATE12/18/2019CMMs. Debbie Schwartz,34 year old female returns for follow-up with history of seizure disorder and anxiety. She denies any seizure events in the last 2 to 3 years. Sleep deprived EEG in the past was normal she is currently on Lamictal 300 mg daily without side effects. She stopped going to her psychotherapist for her anxiety disorder when he told her she was bipolar. She denies any interval medical issues. She does not exercise. She returns for reevaluation. She needs refills on her medication.   Update November 12, 2019 SS: Debbie Schwartz is a 35 year old female with history of seizure disorder and anxiety.  She remains on lamotrigine XR 300 mg daily.  She denies recurrent seizure.  She is tolerating medication without side effect.  She reports continued anxiety.  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  She presents today for evaluation accompanied by her daughter.   Update November 16, 2020 SS:   REVIEW OF SYSTEMS: Out of a complete 14 system review of symptoms, the patient complains only of the following symptoms, and all other reviewed systems are negative.  ALLERGIES: No Known Allergies  HOME MEDICATIONS: Outpatient Medications Prior to Visit  Medication Sig Dispense Refill  . LamoTRIgine 100 MG TB24 24 hour tablet Take 3 tablets (300 mg total) by mouth daily. 270 tablet 3   No facility-administered medications prior to visit.    PAST MEDICAL HISTORY: Past Medical History:  Diagnosis Date  . Anxiety   .  Depression   . Seizures (HCC)     PAST SURGICAL HISTORY: Past Surgical History:  Procedure Laterality Date  . CESAREAN SECTION      FAMILY HISTORY: Family History  Problem  Relation Age of Onset  . Diabetes Father     SOCIAL HISTORY: Social History   Socioeconomic History  . Marital status: Married    Spouse name: Not on file  . Number of children: 2  . Years of education: Not on file  . Highest education level: Not on file  Occupational History  . Not on file  Tobacco Use  . Smoking status: Current Every Day Smoker    Packs/day: 0.50    Types: Cigarettes  . Smokeless tobacco: Never Used  Vaping Use  . Vaping Use: Former  Substance and Sexual Activity  . Alcohol use: Yes    Comment: occasionally   . Drug use: Yes    Types: Marijuana    Comment: last use yesterday  . Sexual activity: Yes    Birth control/protection: None  Other Topics Concern  . Not on file  Social History Narrative  . Not on file   Social Determinants of Health   Financial Resource Strain: Not on file  Food Insecurity: Not on file  Transportation Needs: Not on file  Physical Activity: Not on file  Stress: Not on file  Social Connections: Not on file  Intimate Partner Violence: Not on file      PHYSICAL EXAM  There were no vitals filed for this visit. There is no height or weight on file to calculate BMI.  Generalized: Well developed, in no acute distress   Neurological examination  Mentation: Alert oriented to time, place, history taking. Follows all commands speech and language fluent Cranial nerve II-XII: Pupils were equal round reactive to light. Extraocular movements were full, visual field were full on confrontational test. Facial sensation and strength were normal. Uvula tongue midline. Head turning and shoulder shrug  were normal and symmetric. Motor: The motor testing reveals 5 over 5 strength of all 4 extremities. Good symmetric motor tone is noted throughout.  Sensory: Sensory testing is intact to soft touch on all 4 extremities. No evidence of extinction is noted.  Coordination: Cerebellar testing reveals good finger-nose-finger and heel-to-shin  bilaterally.  Gait and station: Gait is normal. Tandem gait is normal. Romberg is negative. No drift is seen.  Reflexes: Deep tendon reflexes are symmetric and normal bilaterally.   DIAGNOSTIC DATA (LABS, IMAGING, TESTING) - I reviewed patient records, labs, notes, testing and imaging myself where available.  Lab Results  Component Value Date   WBC 5.6 11/12/2019   HGB 13.9 11/12/2019   HCT 39.6 11/12/2019   MCV 93 11/12/2019   PLT 396 11/12/2019      Component Value Date/Time   NA 141 11/12/2019 1411   K 4.4 11/12/2019 1411   CL 104 11/12/2019 1411   CO2 24 11/12/2019 1411   GLUCOSE 72 11/12/2019 1411   GLUCOSE 90 09/05/2018 1112   BUN 5 (L) 11/12/2019 1411   CREATININE 0.73 11/12/2019 1411   CALCIUM 10.0 11/12/2019 1411   PROT 6.8 11/12/2019 1411   ALBUMIN 4.4 11/12/2019 1411   AST 15 11/12/2019 1411   ALT 15 11/12/2019 1411   ALKPHOS 78 11/12/2019 1411   BILITOT <0.2 11/12/2019 1411   GFRNONAA 109 11/12/2019 1411   GFRAA 126 11/12/2019 1411   No results found for: CHOL, HDL, LDLCALC, LDLDIRECT, TRIG, CHOLHDL No results found for: EYCX4G No  results found for: VITAMINB12 No results found for: TSH    ASSESSMENT AND PLAN 34 y.o. year old female  has a past medical history of Anxiety, Depression, and Seizures (HCC). here with:  1.  Seizures -Continue lamotrigine XR 100 mg, 3 tablets at bedtime   I spent 15 minutes with the patient. 50% of this time was spent   Margie Ege, AGNP-C, DNP 11/16/2020, 6:00 AM [facility] [address], [address] [place], [place] [address] [phone]

## 2020-12-20 ENCOUNTER — Other Ambulatory Visit: Payer: Self-pay

## 2020-12-20 ENCOUNTER — Ambulatory Visit
Admission: EM | Admit: 2020-12-20 | Discharge: 2020-12-20 | Disposition: A | Discharge status: Home / Self Care | Payer: Medicaid Other | Attending: Family Medicine | Admitting: Family Medicine

## 2020-12-20 DIAGNOSIS — H9201 Otalgia, right ear: Secondary | ICD-10-CM

## 2020-12-20 DIAGNOSIS — H6121 Impacted cerumen, right ear: Secondary | ICD-10-CM | POA: Diagnosis not present

## 2020-12-20 NOTE — Discharge Instructions (Signed)
We have cleaned out your ears in the office today.  To help prevent wax buildup, you may clean your ears with a solution of 1/2 water and 1/2 peroxide 2-3 times per week.  Follow up with this office or with primary care if symptoms are persisting.  Follow up in the ER for high fever, trouble swallowing, trouble breathing, other concerning symptoms.  

## 2020-12-20 NOTE — ED Triage Notes (Signed)
RT ear pain for past few days

## 2020-12-20 NOTE — ED Provider Notes (Signed)
[facility]   [id number] 12/20/20 Arrival Time: 1402  CC: EAR PAIN  SUBJECTIVE: History from: patient.  ZADA HASER is a 35 y.o. female who presents with of right ear pain and decreased hearing from the right ear for the last 2 to 3 days.  Denies a precipitating event, such as swimming or wearing ear plugs. Patient states the pain is constant and achy in character. Patient has not taken OTC medications for this. Symptoms are made worse with lying down. Denies fever, chills, fatigue, sinus pain, rhinorrhea, ear discharge, sore throat, SOB, wheezing, chest pain, nausea, changes in bowel or bladder habits.    ROS: As per HPI.  All other pertinent ROS negative.     Past Medical History:  Diagnosis Date  . Anxiety   . Depression   . Seizures (HCC)    Past Surgical History:  Procedure Laterality Date  . CESAREAN SECTION     No Known Allergies No current facility-administered medications on file prior to encounter.   Current Outpatient Medications on File Prior to Encounter  Medication Sig Dispense Refill  . LamoTRIgine 100 MG TB24 24 hour tablet Take 3 tablets (300 mg total) by mouth daily. 270 tablet 3   Social History   Socioeconomic History  . Marital status: Married    Spouse name: Not on file  . Number of children: 2  . Years of education: Not on file  . Highest education level: Not on file  Occupational History  . Not on file  Tobacco Use  . Smoking status: Current Every Day Smoker    Packs/day: 0.50    Types: Cigarettes  . Smokeless tobacco: Never Used  Vaping Use  . Vaping Use: Former  Substance and Sexual Activity  . Alcohol use: Yes    Comment: occasionally   . Drug use: Yes    Types: Marijuana    Comment: last use yesterday  . Sexual activity: Yes    Birth control/protection: None  Other Topics Concern  . Not on file  Social History Narrative  . Not on file   Social Determinants of Health   Financial Resource Strain: Not on file   Food Insecurity: Not on file  Transportation Needs: Not on file  Physical Activity: Not on file  Stress: Not on file  Social Connections: Not on file  Intimate Partner Violence: Not on file   Family History  Problem Relation Age of Onset  . Diabetes Father     OBJECTIVE:  Vitals:   12/20/20 1455  BP: 100/71  Pulse: (!) 115  Resp: 16  Temp: 98 F (36.7 C)  TempSrc: Oral  SpO2: 98%     General appearance: alert; appears fatigued HEENT: Ears: L EAC with minimal wax,R EAC with cerumen impaction, TMs pearly gray with visible cone of light, without erythema ; Eyes: PERRL, EOMI grossly; Sinuses nontender to palpation; Nose: clear rhinorrhea; Throat: oropharynx mildly erythematous, tonsils 1+ without white tonsillar exudates, uvula midline Neck: supple without LAD Lungs: unlabored respirations, symmetrical air entry; cough: absent; no respiratory distress Heart: regular rate and rhythm.  Radial pulses 2+ symmetrical bilaterally Skin: warm and dry Psychological: alert and cooperative; normal mood and affect  Imaging: No results found.   ASSESSMENT & PLAN:  1. Impacted cerumen of right ear    Right ear irrigated in office today with good results May clean the ear with half water, half peroxide solution 2-3 times a week to help prevent cerumen impaction Continue to use OTC ibuprofen  and/ or tylenol as needed for pain control Follow up with PCP if symptoms persists Return here or go to the ER if you have any new or worsening symptoms   Reviewed expectations re: course of current medical issues. Questions answered. Outlined signs and symptoms indicating need for more acute intervention. Patient verbalized understanding. After Visit Summary given.         Moshe Cipro, NP 12/20/20 1652

## 2021-01-19 NOTE — Progress Notes (Signed)
PATIENT: Debbie Schwartz DOB: November 10, 1986  REASON FOR VISIT: follow up HISTORY FROM: patient  HISTORY OF PRESENT ILLNESS: Today 01/20/21  HISTORY HISTORY OF PRESENT ILLNESS:Debbie L Wilsonis 35 years old right-handed Caucasian female follow-up for seizure,  She hadseizures since 2010, had first seizure wasin October 15 2009, generalized tonic-clonic seizure, CAT scan was essentially normal, MRI of the brain, and EEG was normal as well.  She had recurrent seizureonDecember 6 2010, was loaded with Keppra at emergency room, later due to her complains of mood disorder, she was switched to Lamictal, currently taking lamotrigin 100 mg 3 tablets every night, tolerating well,  She went through normal pregnancy twice, while taking lamotrigine, reported last seizure was in 2013.  UPDATE Aug 26 2015: She has no recurrent seizure, was taken to the emergency room October 2015 for an anxiety episode, happened during her psychotherapy section, with hyperventilation, bilateral hands numbness, posturing,  She tolerating lamotrigine xr 100 mg 3 tablets a day, also taking Latuda, her depression is under better control,  Today she complains of excessive fatigue, daytime sleepiness, ESS 10, FSS 43. She also has frequent awakening at night time, frequent body jerking movement.   UPDATE Oct 25th 2017:YY She had one recurrent episode of seizure-like activity during her psychotherapy section for her anxiety disorder, her hands looked up, she became nervous, become confused, but there was no total loss of consciousness, she has been complying with her lamotrigine XL 100 mg 3 tablets every night, this is only medication she is taking.  UPDATE 10/25/2018CMMs. Fleeger, 35 year old female returns for follow-up with history of seizure disorder and anxiety. She denies any seizure events in the last year. Sleep deprived EEG was normal . Lamictal level 8.1 after last visit which is therapeutic. She  denies side effects to Lamictal. She is currently taking 300 mg daily. She was going to a psychotherapist for her anxiety disorder but stopped going when she was told she was bipolar.She returns for reevaluation. She denies any interval medical issues. She does not exercise UPDATE12/18/2019CMMs. [name],35 year old female returns for follow-up with history of seizure disorder and anxiety. She denies any seizure events in the last 2 to 3 years. Sleep deprived EEG in the past was normal she is currently on Lamictal 300 mg daily without side effects. She stopped going to her psychotherapist for her anxiety disorder when he told her she was bipolar. She denies any interval medical issues. She does not exercise. She returns for reevaluation. She needs refills on her medication.   Update November 12, 2019 SS: Ms. Debbie Schwartz is a 35 year old female with history of seizure disorder and anxiety.  She remains on lamotrigine XR 300 mg daily.  She denies recurrent seizure.  She is tolerating medication without side effect.  She reports continued anxiety.  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  She presents today for evaluation accompanied by her daughter.  Update January 20, 2021 SS: Remains on lamotrigine XR 300 mg daily.  Tolerating well, no recurrent seizure in about 10 years.  She has 3 children, does not work.  No changes to medical history, sees PCP, gets labs.  REVIEW OF SYSTEMS: Out of a complete 14 system review of symptoms, the patient complains only of the following symptoms, and all other reviewed systems are negative.  n/a  ALLERGIES: No Known Allergies  HOME MEDICATIONS: Outpatient Medications Prior to Visit  Medication Sig Dispense Refill  . LamoTRIgine 100 MG TB24 24 hour tablet Take 3 tablets (  300 mg total) by mouth daily. 270 tablet 3   No facility-administered medications prior to visit.    PAST MEDICAL HISTORY: Past Medical History:  Diagnosis Date  .  Anxiety   . Depression   . Seizures (HCC)     PAST SURGICAL HISTORY: Past Surgical History:  Procedure Laterality Date  . CESAREAN SECTION      FAMILY HISTORY: Family History  Problem Relation Age of Onset  . Diabetes Father     SOCIAL HISTORY: Social History   Socioeconomic History  . Marital status: Married    Spouse name: Not on file  . Number of children: 2  . Years of education: Not on file  . Highest education level: Not on file  Occupational History  . Not on file  Tobacco Use  . Smoking status: Current Every Day Smoker    Packs/day: 0.50    Types: Cigarettes  . Smokeless tobacco: Never Used  Vaping Use  . Vaping Use: Former  Substance and Sexual Activity  . Alcohol use: Yes    Comment: occasionally   . Drug use: Yes    Types: Marijuana    Comment: last use yesterday  . Sexual activity: Yes    Birth control/protection: None  Other Topics Concern  . Not on file  Social History Narrative  . Not on file   Social Determinants of Health   Financial Resource Strain: Not on file  Food Insecurity: Not on file  Transportation Needs: Not on file  Physical Activity: Not on file  Stress: Not on file  Social Connections: Not on file  Intimate Partner Violence: Not on file   PHYSICAL EXAM  Vitals:   01/20/21 1017  BP: 108/68  Pulse: 91  Weight: 154 lb (69.9 kg)  Height: 5\' 4"  (1.626 m)   Body mass index is 26.43 kg/m.  Generalized: Well developed, in no acute distress   Neurological examination  Mentation: Alert oriented to time, place, history taking. Follows all commands speech and language fluent Cranial nerve II-XII: Pupils were equal round reactive to light. Extraocular movements were full, visual field were full on confrontational test. Facial sensation and strength were normal. Head turning and shoulder shrug  were normal and symmetric. Motor: The motor testing reveals 5 over 5 strength of all 4 extremities. Good symmetric motor tone is noted  throughout.  Sensory: Sensory testing is intact to soft touch on all 4 extremities. No evidence of extinction is noted.  Coordination: Cerebellar testing reveals good finger-nose-finger and heel-to-shin bilaterally.  Gait and station: Gait is normal. Reflexes: Deep tendon reflexes are symmetric and normal bilaterally.   DIAGNOSTIC DATA (LABS, IMAGING, TESTING) - I reviewed patient records, labs, notes, testing and imaging myself where available.  Lab Results  Component Value Date   WBC 5.6 11/12/2019   HGB 13.9 11/12/2019   HCT 39.6 11/12/2019   MCV 93 11/12/2019   PLT 396 11/12/2019      Component Value Date/Time   NA 141 11/12/2019 1411   K 4.4 11/12/2019 1411   CL 104 11/12/2019 1411   CO2 24 11/12/2019 1411   GLUCOSE 72 11/12/2019 1411   GLUCOSE 90 09/05/2018 1112   BUN 5 (L) 11/12/2019 1411   CREATININE 0.73 11/12/2019 1411   CALCIUM 10.0 11/12/2019 1411   PROT 6.8 11/12/2019 1411   ALBUMIN 4.4 11/12/2019 1411   AST 15 11/12/2019 1411   ALT 15 11/12/2019 1411   ALKPHOS 78 11/12/2019 1411   BILITOT <0.2 11/12/2019 1411  GFRNONAA 109 11/12/2019 1411   GFRAA 126 11/12/2019 1411   No results found for: CHOL, HDL, LDLCALC, LDLDIRECT, TRIG, CHOLHDL No results found for: GYKZ9D No results found for: VITAMINB12 No results found for: TSH  ASSESSMENT AND PLAN 35 y.o. year old female  has a past medical history of Anxiety, Depression, and Seizures (HCC). here with:  1.  Seizures -No recurrent seizure -Continue lamotrigine XR 100 mg tablet, 3 tablets at bedtime -Doesn't want labs today, PCP can check, Lamictal level was 7.11 November 2019 -Call for recurrent seizure, otherwise follow-up 1 year or sooner if needed  I spent 20 minutes of face-to-face and non-face-to-face time with patient.  This included previsit chart review, lab review, study review, order entry, electronic health record documentation, patient education.  Margie Ege, AGNP-C, DNP 01/20/2021, 10:32  AM [facility] [address] [place], [place] [address] [phone]

## 2021-01-20 ENCOUNTER — Other Ambulatory Visit: Payer: Self-pay

## 2021-01-20 ENCOUNTER — Ambulatory Visit: Payer: Medicaid Other | Admitting: Neurology

## 2021-01-20 ENCOUNTER — Encounter: Payer: Self-pay | Admitting: Neurology

## 2021-01-20 VITALS — BP 108/68 | HR 91 | Ht 64.0 in | Wt 154.0 lb

## 2021-01-20 DIAGNOSIS — R569 Unspecified convulsions: Secondary | ICD-10-CM | POA: Diagnosis not present

## 2021-01-20 MED ORDER — LAMOTRIGINE ER 100 MG PO TB24: 300.0000 mg | ORAL_TABLET | Freq: Every day | ORAL | 4 refills | Status: DC

## 2021-01-20 NOTE — Patient Instructions (Signed)
Continue current medications Have primary doctor check labs  Call for seizures  See you back in 1 year

## 2021-06-15 ENCOUNTER — Other Ambulatory Visit: Payer: Self-pay | Admitting: Neurology

## 2021-07-19 ENCOUNTER — Telehealth: Payer: Self-pay | Admitting: Neurology

## 2021-07-19 NOTE — Telephone Encounter (Signed)
Pt's husband called requesting refill for wife's LamoTRIgine 100 MG TB24 24 hour tablet. [place] and took the medication now pt doesn't have any. [facility].

## 2021-07-19 NOTE — Telephone Encounter (Signed)
I spoke to the patient's husband. This is the first time they have encounter this problem. I told him the pharmacy will be able to get a "lost or stolen" medication override from the insurance company. I also called her pharmacy and spoke to [facility] who it aware of this issue. She will take care of the refill. While I was on the phone w/ Melissa, the husband was holding on the other line at the pharmacy and she will talk with him.

## 2021-08-08 ENCOUNTER — Other Ambulatory Visit: Payer: Self-pay

## 2021-08-08 ENCOUNTER — Encounter: Payer: Self-pay | Admitting: Emergency Medicine

## 2021-08-08 ENCOUNTER — Ambulatory Visit
Admission: EM | Admit: 2021-08-08 | Discharge: 2021-08-08 | Disposition: A | Discharge status: Home / Self Care | Payer: Medicaid Other | Attending: Family Medicine | Admitting: Family Medicine

## 2021-08-08 DIAGNOSIS — S00522A Blister (nonthermal) of oral cavity, initial encounter: Secondary | ICD-10-CM

## 2021-08-08 MED ORDER — AMOXICILLIN 875 MG PO TABS: 875.0000 mg | ORAL_TABLET | Freq: Two times a day (BID) | ORAL | 0 refills | Status: AC

## 2021-08-08 NOTE — ED Provider Notes (Signed)
[facility]    CSN: [id number] Arrival date & time: 08/08/21  1302      History   Chief Complaint No chief complaint on file.   HPI Debbie Schwartz is a 35 y.o. female.   HPI Patient presents today for evaluation of a sore on gum.  Patient was seen by dentist 5 days ago and had a filling placed in her tooth.  She reports 2 days later noticing a fluid-filled sore on her gum which subsequently ruptured however is now discolored and tender locally to the gum.  She has not had fever, nausea or vomiting.  Past Medical History:  Diagnosis Date   Anxiety    Depression    Seizures (HCC)     Patient Active Problem List   Diagnosis Date Noted   Seizures (HCC)    Anxiety    Depression     Past Surgical History:  Procedure Laterality Date   CESAREAN SECTION      OB History   No obstetric history on file.      Home Medications    Prior to Admission medications   Medication Sig Start Date End Date Taking? Authorizing Provider  LamoTRIgine 100 MG TB24 24 hour tablet Take 3 tablets (300 mg total) by mouth daily. 01/20/21   Glean Salvo, NP    Family History Family History  Problem Relation Age of Onset   Diabetes Father     Social History Social History   Tobacco Use   Smoking status: Every Day    Packs/day: 0.50    Types: Cigarettes   Smokeless tobacco: Never  Vaping Use   Vaping Use: Former  Substance Use Topics   Alcohol use: Yes    Comment: occasionally    Drug use: Yes    Types: Marijuana    Comment: last use yesterday     Allergies   Patient has no known allergies.   Review of Systems Review of Systems Pertinent negatives listed in HPI   Physical Exam Triage Vital Signs ED Triage Vitals [08/08/21 1403]  Enc Vitals Group     BP 115/77     Pulse Rate 83     Resp 16     Temp 98 F (36.7 C)     Temp Source Oral     SpO2 98 %     Weight      Height      Head Circumference      Peak Flow      Pain Score 7     Pain  Loc      Pain Edu?      Excl. in GC?    No data found.  Updated Vital Signs BP 115/77 (BP Location: Right Arm)   Pulse 83   Temp 98 F (36.7 C) (Oral)   Resp 16   LMP 08/06/2021 (Exact Date)   SpO2 98%   Visual Acuity Right Eye Distance:   Left Eye Distance:   Bilateral Distance:    Right Eye Near:   Left Eye Near:    Bilateral Near:     Physical Exam Constitutional:      Appearance: Normal appearance.  HENT:     Mouth/Throat:   Cardiovascular:     Rate and Rhythm: Normal rate and regular rhythm.  Pulmonary:     Effort: Pulmonary effort is normal.     Breath sounds: Normal breath sounds.  Skin:    General: Skin is warm.  Capillary Refill: Capillary refill takes less than 2 seconds.  Neurological:     General: No focal deficit present.     Mental Status: She is alert.  Psychiatric:        Mood and Affect: Mood normal.        Thought Content: Thought content normal.        Judgment: Judgment normal.     UC Treatments / Results  Labs (all labs ordered are listed, but only abnormal results are displayed) Labs Reviewed - No data to display  EKG   Radiology No results found.  Procedures Procedures (including critical care time)  Medications Ordered in UC Medications - No data to display  Initial Impression / Assessment and Plan / UC Course  I have reviewed the triage vital signs and the nursing notes.  Pertinent labs & imaging results that were available during my care of the patient were reviewed by me and considered in my medical decision making (see chart for details).     Blister of the gingival treating prophylactically against dental infection Treatment per discharge instructions. If symptoms worsen follow-up with dental provider Final Clinical Impressions(s) / UC Diagnoses   Final diagnoses:  Blister of gingiva without infection, initial encounter   Discharge Instructions   None    ED Prescriptions   None    PDMP not reviewed  this encounter.   Bing Neighbors, FNP 08/08/21 [phone]

## 2021-08-08 NOTE — ED Triage Notes (Signed)
Sore on gum since Saturday.  Recently dental procedure on Wednesday.

## 2021-10-19 ENCOUNTER — Other Ambulatory Visit: Payer: Self-pay | Admitting: Neurology

## 2021-11-17 ENCOUNTER — Encounter: Payer: Self-pay | Admitting: General Surgery

## 2021-11-17 ENCOUNTER — Other Ambulatory Visit: Payer: Self-pay

## 2021-11-17 ENCOUNTER — Ambulatory Visit (INDEPENDENT_AMBULATORY_CARE_PROVIDER_SITE_OTHER): Payer: Medicaid Other | Admitting: General Surgery

## 2021-11-17 VITALS — BP 100/66 | HR 89 | Temp 98.5°F | Resp 16 | Ht 64.0 in | Wt 134.0 lb

## 2021-11-17 DIAGNOSIS — K645 Perianal venous thrombosis: Secondary | ICD-10-CM

## 2021-11-17 NOTE — Progress Notes (Signed)
Debbie Schwartz; [id number]; Feb 18, 1986   HPI Patient is a 35 year old white female who was referred to my care by Alvina Filbert, MD for evaluation and treatment of a thrombosed hemorrhoid.  Patient states she has had the thrombosed hemorrhoid for the past 2 weeks.  She has been using Proctofoam cream and it has shrunk in size.  It is tender to touch but not bleeding.  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  She has never had a hemorrhoidectomy. Past Medical History:  Diagnosis Date   Anxiety    Depression    Seizures (HCC)     Past Surgical History:  Procedure Laterality Date   CESAREAN SECTION      Family History  Problem Relation Age of Onset   Diabetes Father     Current Outpatient Medications on File Prior to Visit  Medication Sig Dispense Refill   hydrocortisone-pramoxine (PROCTOFOAM HC) rectal foam 1 application     lamoTRIgine (LAMICTAL XR) 100 MG 24 hour tablet TAKE (3) TABLETS BY MOUTH ONCE DAILY. 90 tablet 0   No current facility-administered medications on file prior to visit.    No Known Allergies  Social History   Substance and Sexual Activity  Alcohol Use Yes   Comment: occasionally     Social History   Tobacco Use  Smoking Status Every Day   Packs/day: 0.50   Types: Cigarettes  Smokeless Tobacco Never    Review of Systems  Constitutional: Negative.   HENT: Negative.    Eyes: Negative.   Respiratory: Negative.    Cardiovascular: Negative.   Gastrointestinal: Negative.   Genitourinary: Negative.   Musculoskeletal: Negative.   Skin: Negative.   Neurological:  Positive for seizures.  Endo/Heme/Allergies: Negative.   Psychiatric/Behavioral:  The patient is nervous/anxious.    Objective   Vitals:   11/17/21 1023  BP: 100/66  Pulse: 89  Resp: 16  Temp: 98.5 F (36.9 C)  SpO2: 98%    Physical Exam Vitals reviewed.  Constitutional:      Appearance: Normal appearance. She is normal weight. She is not ill-appearing.   HENT:     Head: Normocephalic and atraumatic.  Cardiovascular:     Rate and Rhythm: Normal rate and regular rhythm.     Heart sounds: Normal heart sounds. No murmur heard.   No friction rub. No gallop.  Pulmonary:     Effort: Pulmonary effort is normal. No respiratory distress.     Breath sounds: Normal breath sounds. No stridor. No wheezing, rhonchi or rales.  Genitourinary:    Comments: Thrombosed hemorrhoid with clot exposed at the 7 o'clock position.  Multiple skin tags present.  Examination limited secondary to pain.  No active bleeding is noted. Skin:    General: Skin is warm and dry.  Neurological:     Mental Status: She is alert and oriented to person, place, and time.   Primary care notes reviewed Assessment  Thrombosed hemorrhoid, external hemorrhoidal skin tags Plan  I did offer evacuation of the clot in the office, but the patient would prefer formal excision.  The patient is scheduled for a hemorrhoidectomy on 11/25/2021.  The risks and benefits of the procedure including bleeding, infection, pain, and the possibility of recurrence of the hemorrhoidal disease were fully explained to the patient, who gave informed consent.

## 2021-11-17 NOTE — H&P (Signed)
Debbie Schwartz; [id number]; Feb 18, 1986   HPI Patient is a 35 year old white female who was referred to my care by Alvina Filbert, MD for evaluation and treatment of a thrombosed hemorrhoid.  Patient states she has had the thrombosed hemorrhoid for the past 2 weeks.  She has been using Proctofoam cream and it has shrunk in size.  It is tender to touch but not bleeding.  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  She has never had a hemorrhoidectomy. Past Medical History:  Diagnosis Date   Anxiety    Depression    Seizures (HCC)     Past Surgical History:  Procedure Laterality Date   CESAREAN SECTION      Family History  Problem Relation Age of Onset   Diabetes Father     Current Outpatient Medications on File Prior to Visit  Medication Sig Dispense Refill   hydrocortisone-pramoxine (PROCTOFOAM HC) rectal foam 1 application     lamoTRIgine (LAMICTAL XR) 100 MG 24 hour tablet TAKE (3) TABLETS BY MOUTH ONCE DAILY. 90 tablet 0   No current facility-administered medications on file prior to visit.    No Known Allergies  Social History   Substance and Sexual Activity  Alcohol Use Yes   Comment: occasionally     Social History   Tobacco Use  Smoking Status Every Day   Packs/day: 0.50   Types: Cigarettes  Smokeless Tobacco Never    Review of Systems  Constitutional: Negative.   HENT: Negative.    Eyes: Negative.   Respiratory: Negative.    Cardiovascular: Negative.   Gastrointestinal: Negative.   Genitourinary: Negative.   Musculoskeletal: Negative.   Skin: Negative.   Neurological:  Positive for seizures.  Endo/Heme/Allergies: Negative.   Psychiatric/Behavioral:  The patient is nervous/anxious.    Objective   Vitals:   11/17/21 1023  BP: 100/66  Pulse: 89  Resp: 16  Temp: 98.5 F (36.9 C)  SpO2: 98%    Physical Exam Vitals reviewed.  Constitutional:      Appearance: Normal appearance. She is normal weight. She is not ill-appearing.   HENT:     Head: Normocephalic and atraumatic.  Cardiovascular:     Rate and Rhythm: Normal rate and regular rhythm.     Heart sounds: Normal heart sounds. No murmur heard.   No friction rub. No gallop.  Pulmonary:     Effort: Pulmonary effort is normal. No respiratory distress.     Breath sounds: Normal breath sounds. No stridor. No wheezing, rhonchi or rales.  Genitourinary:    Comments: Thrombosed hemorrhoid with clot exposed at the 7 o'clock position.  Multiple skin tags present.  Examination limited secondary to pain.  No active bleeding is noted. Skin:    General: Skin is warm and dry.  Neurological:     Mental Status: She is alert and oriented to person, place, and time.   Primary care notes reviewed Assessment  Thrombosed hemorrhoid, external hemorrhoidal skin tags Plan  I did offer evacuation of the clot in the office, but the patient would prefer formal excision.  The patient is scheduled for a hemorrhoidectomy on 11/25/2021.  The risks and benefits of the procedure including bleeding, infection, pain, and the possibility of recurrence of the hemorrhoidal disease were fully explained to the patient, who gave informed consent.

## 2021-11-22 NOTE — Patient Instructions (Addendum)
Debbie Schwartz  11/22/2021     @PREFPERIOPPHARMACY @   Your procedure is scheduled on  11/25/2021.   Report to 11/27/2021 at  0700  A.M.   Call this number if you have problems the morning of surgery:  [phone]   Remember:  Do not eat or drink after midnight.      Take these medicines the morning of surgery with A SIP OF WATER                                                  lamictal.     Do not wear jewelry, make-up or nail polish.  Do not wear lotions, powders, or perfumes, or deodorant.  Do not shave 48 hours prior to surgery.  Men may shave face and neck.  Do not bring valuables to the hospital.  [facility] is not responsible for any belongings or valuables.  Contacts, dentures or bridgework may not be worn into surgery.  Leave your suitcase in the car.  After surgery it may be brought to your room.  For patients admitted to the hospital, discharge time will be determined by your treatment team.  Patients discharged the day of surgery will not be allowed to drive home and must have someone with them for 24 hours .    Special instructions:   DO NOT smoke tobacco or vape for 24 hours before your procedure.  Please read over the following fact sheets that you were given. Coughing and Deep Breathing, Surgical Site Infection Prevention, Anesthesia Post-op Instructions, and Care and Recovery After Surgery      Surgical Procedures for Hemorrhoids Surgical procedures can be used to treat hemorrhoids. Hemorrhoids are swollen veins that are inside the rectum (internal hemorrhoids) or around the anus (external hemorrhoids). They are caused by increased pressure in the anal area. This pressure may result from straining to have a bowel movement (constipation), diarrhea, pregnancy, obesity, or sitting for long periods of time. Hemorrhoids can cause symptoms such as pain and bleeding. Surgery may be needed if diet changes, lifestyle changes, and other treatments  do not help your symptoms. Common surgical methods that may be used include: Closed hemorrhoidectomy. The hemorrhoids are surgically removed, and the incisions are closed with stitches (sutures). Open hemorrhoidectomy. The hemorrhoids are surgically removed, but the incisions are allowed to heal without sutures. Stapled hemorrhoidectomy. The hemorrhoids are partially removed, and the incisions are closed with staples. Tell a health care provider about: Any allergies you have. All medicines you are taking, including vitamins, herbs, eye drops, creams, and over-the-counter medicines. Any problems you or family members have had with anesthetic medicines. Any blood disorders you have. Any surgeries you have had. Any medical conditions you have. Whether you are pregnant or may be pregnant. What are the risks? Generally, this is a safe procedure. However, problems may occur, including: Infection. Bleeding. Allergic reactions to medicines. Damage to other structures or organs. Pain. Constipation. Difficulty passing urine. Narrowing of the anal canal (stenosis). Difficulty controlling bowel movements (incontinence). Recurring hemorrhoids. A new passage (fistula) that forms between the anus or rectum and another area. What happens before the procedure? Medicines Ask your health care provider about: Changing or stopping your regular medicines. This is especially important if you are taking diabetes medicines or blood thinners. Taking medicines such as  aspirin and ibuprofen. These medicines can thin your blood. Do not take these medicines unless your health care provider tells you to take them. Taking over-the-counter medicines, vitamins, herbs, and supplements. Staying hydrated Follow instructions from your health care provider about hydration, which may include: Up to 2 hours before the procedure - you may continue to drink clear liquids, such as water, clear fruit juice, black coffee, and  plain tea.  Eating and drinking Follow instructions from your health care provider about eating and drinking, which may include: 8 hours before the procedure - stop eating heavy meals or foods, such as meat, fried foods, or fatty foods. 6 hours before the procedure - stop eating light meals or foods, such as toast or cereal. 6 hours before the procedure - stop drinking milk or drinks that contain milk. 2 hours before the procedure - stop drinking clear liquids. General instructions You may need to have a procedure to examine the inside of your colon with a scope (colonoscopy). Your health care provider may do this to make sure that there are no other causes for your bleeding or pain. You may be instructed to take a laxative and an enema to clean out your colon before surgery (bowel prep). Carefully follow instructions from your health care provider about bowel prep. Plan to have someone take you home from the hospital or clinic. Plan to have a responsible adult care for you for at least 24 hours after you leave the hospital or clinic. This is important. Ask your health care provider: How your surgery site will be marked. What steps will be taken to help prevent infection. These may include: Washing skin with a germ-killing soap. Taking antibiotic medicine. What happens during the procedure? An IV will be inserted into one of your veins. You will be given one or more of the following: A medicine to help you relax (sedative). A medicine to numb the area (local anesthetic). A medicine to make you fall asleep (general anesthetic). A medicine that is injected into an area of your body to numb everything below the injection site (regional anesthetic). A lubricating jelly may be placed into your rectum. Your surgeon will insert a short scope (anoscope) into your rectum to examine the hemorrhoids. One of the following surgical methods will be used to remove the hemorrhoids: Closed  hemorrhoidectomy. Your surgeon will use surgical instruments to open the tissue around the hemorrhoids. The veins that supply the hemorrhoids will be tied off with a suture. The hemorrhoids will be removed. The tissue that surrounds the hemorrhoids will be closed with sutures that your body can absorb (absorbable sutures). Open hemorrhoidectomy. The hemorrhoids will be removed with surgical instruments. The incisions will be left open to heal without sutures. Stapled hemorrhoidectomy. Your surgeon will use a circular stapling device to partially remove the hemorrhoids. The device will be inserted into your anus. It will remove a circular ring of tissue that includes hemorrhoid tissue and some tissue above the hemorrhoids. The staples in the device will close the edges of the tissue. This will cut off the blood supply to any remaining hemorrhoids and pull the tissue back into place. Each of these procedures may vary among health care providers and hospitals. What happens after the procedure? Your blood pressure, heart rate, breathing rate, and blood oxygen level may be monitored until you leave the hospital or clinic. You will be given pain medicine as needed. Do not drive for 24 hours if you were given a sedative during  your procedure. Summary Surgery may be needed for hemorrhoids if diet changes, lifestyle changes, and other treatments do not help your symptoms. There are three common methods of surgery that are used to treat hemorrhoids. Follow instructions from your health care provider about taking medicines and about eating and drinking before the procedure. You may be instructed to take a laxative and an enema to clean out your colon before surgery (bowel prep). This information is not intended to replace advice given to you by your health care provider. Make sure you discuss any questions you have with your health care provider. Document Revised: 06/01/2021 Document Reviewed:  06/01/2021 Elsevier Patient Education  2022 [employer] General Anesthesia, Adult, Care After This sheet gives you information about how to care for yourself after your procedure. Your health care provider may also give you more specific instructions. If you have problems or questions, contact your health care provider. What can I expect after the procedure? After the procedure, the following side effects are common: Pain or discomfort at the IV site. Nausea. Vomiting. Sore throat. Trouble concentrating. Feeling cold or chills. Feeling weak or tired. Sleepiness and fatigue. Soreness and body aches. These side effects can affect parts of the body that were not involved in surgery. Follow these instructions at home: For the time period you were told by your health care provider:  Rest. Do not participate in activities where you could fall or become injured. Do not drive or use machinery. Do not drink alcohol. Do not take sleeping pills or medicines that cause drowsiness. Do not make important decisions or sign legal documents. Do not take care of children on your own. Eating and drinking Follow any instructions from your health care provider about eating or drinking restrictions. When you feel hungry, start by eating small amounts of foods that are soft and easy to digest (bland), such as toast. Gradually return to your regular diet. Drink enough fluid to keep your urine pale yellow. If you vomit, rehydrate by drinking water, juice, or clear broth. General instructions If you have sleep apnea, surgery and certain medicines can increase your risk for breathing problems. Follow instructions from your health care provider about wearing your sleep device: Anytime you are sleeping, including during daytime naps. While taking prescription pain medicines, sleeping medicines, or medicines that make you drowsy. Have a responsible adult stay with you for the time you are told. It is  important to have someone help care for you until you are awake and alert. Return to your normal activities as told by your health care provider. Ask your health care provider what activities are safe for you. Take over-the-counter and prescription medicines only as told by your health care provider. If you smoke, do not smoke without supervision. Keep all follow-up visits as told by your health care provider. This is important. Contact a health care provider if: You have nausea or vomiting that does not get better with medicine. You cannot eat or drink without vomiting. You have pain that does not get better with medicine. You are unable to pass urine. You develop a skin rash. You have a fever. You have redness around your IV site that gets worse. Get help right away if: You have difficulty breathing. You have chest pain. You have blood in your urine or stool, or you vomit blood. Summary After the procedure, it is common to have a sore throat or nausea. It is also common to feel tired. Have a responsible adult stay with  you for the time you are told. It is important to have someone help care for you until you are awake and alert. When you feel hungry, start by eating small amounts of foods that are soft and easy to digest (bland), such as toast. Gradually return to your regular diet. Drink enough fluid to keep your urine pale yellow. Return to your normal activities as told by your health care provider. Ask your health care provider what activities are safe for you. This information is not intended to replace advice given to you by your health care provider. Make sure you discuss any questions you have with your health care provider. Document Revised: 08/05/2020 Document Reviewed: 03/04/2020 Elsevier Patient Education  2022 [employer].

## 2021-11-23 ENCOUNTER — Encounter (HOSPITAL_COMMUNITY)
Admission: RE | Admit: 2021-11-23 | Discharge: 2021-11-23 | Disposition: A | Discharge status: Home / Self Care | Payer: Medicaid Other | Source: Ambulatory Visit | Attending: General Surgery | Admitting: General Surgery

## 2021-11-23 DIAGNOSIS — Z419 Encounter for procedure for purposes other than remedying health state, unspecified: Secondary | ICD-10-CM

## 2021-12-06 NOTE — Patient Instructions (Signed)
Debbie Schwartz  12/06/2021     @PREFPERIOPPHARMACY @   Your procedure is scheduled on  12/14/2021.   Report to 02/11/2022 at  0700  A.M.   Call this number if you have problems the morning of surgery:  [phone]   Remember:  Do not eat or drink after midnight.      Take these medicines the morning of surgery with A SIP OF WATER                                     Lamictal     Do not wear jewelry, make-up or nail polish.  Do not wear lotions, powders, or perfumes, or deodorant.  Do not shave 48 hours prior to surgery.  Men may shave face and neck.  Do not bring valuables to the hospital.  [facility] is not responsible for any belongings or valuables.  Contacts, dentures or bridgework may not be worn into surgery.  Leave your suitcase in the car.  After surgery it may be brought to your room.  For patients admitted to the hospital, discharge time will be determined by your treatment team.  Patients discharged the day of surgery will not be allowed to drive home and must have someone with them for 24 hours.    Special instructions:   DO NOT smoke tobacco or vape for 24 hours before your procedure.  Please read over the following fact sheets that you were given. Coughing and Deep Breathing, Surgical Site Infection Prevention, Anesthesia Post-op Instructions, and Care and Recovery After Surgery      Surgical Procedures for Hemorrhoids Surgical procedures can be used to treat hemorrhoids. Hemorrhoids are swollen veins that are inside the rectum (internal hemorrhoids) or around the anus (external hemorrhoids). They are caused by increased pressure in the anal area. This pressure may result from straining to have a bowel movement (constipation), diarrhea, pregnancy, obesity, or sitting for long periods of time. Hemorrhoids can cause symptoms such as pain and bleeding. Surgery may be needed if diet changes, lifestyle changes, and other treatments do not help  your symptoms. Common surgical methods that may be used include: Closed hemorrhoidectomy. The hemorrhoids are surgically removed, and the incisions are closed with stitches (sutures). Open hemorrhoidectomy. The hemorrhoids are surgically removed, but the incisions are allowed to heal without sutures. Stapled hemorrhoidectomy. The hemorrhoids are partially removed, and the incisions are closed with staples. Tell a health care provider about: Any allergies you have. All medicines you are taking, including vitamins, herbs, eye drops, creams, and over-the-counter medicines. Any problems you or family members have had with anesthetic medicines. Any blood disorders you have. Any surgeries you have had. Any medical conditions you have. Whether you are pregnant or may be pregnant. What are the risks? Generally, this is a safe procedure. However, problems may occur, including: Infection. Bleeding. Allergic reactions to medicines. Damage to other structures or organs. Pain. Constipation. Difficulty passing urine. Narrowing of the anal canal (stenosis). Difficulty controlling bowel movements (incontinence). Recurring hemorrhoids. A new passage (fistula) that forms between the anus or rectum and another area. What happens before the procedure? Medicines Ask your health care provider about: Changing or stopping your regular medicines. This is especially important if you are taking diabetes medicines or blood thinners. Taking medicines such as aspirin and ibuprofen. These medicines can thin your blood. Do not take  these medicines unless your health care provider tells you to take them. Taking over-the-counter medicines, vitamins, herbs, and supplements. Staying hydrated Follow instructions from your health care provider about hydration, which may include: Up to 2 hours before the procedure - you may continue to drink clear liquids, such as water, clear fruit juice, black coffee, and plain  tea.  Eating and drinking Follow instructions from your health care provider about eating and drinking, which may include: 8 hours before the procedure - stop eating heavy meals or foods, such as meat, fried foods, or fatty foods. 6 hours before the procedure - stop eating light meals or foods, such as toast or cereal. 6 hours before the procedure - stop drinking milk or drinks that contain milk. 2 hours before the procedure - stop drinking clear liquids. General instructions You may need to have a procedure to examine the inside of your colon with a scope (colonoscopy). Your health care provider may do this to make sure that there are no other causes for your bleeding or pain. You may be instructed to take a laxative and an enema to clean out your colon before surgery (bowel prep). Carefully follow instructions from your health care provider about bowel prep. Plan to have someone take you home from the hospital or clinic. Plan to have a responsible adult care for you for at least 24 hours after you leave the hospital or clinic. This is important. Ask your health care provider: How your surgery site will be marked. What steps will be taken to help prevent infection. These may include: Washing skin with a germ-killing soap. Taking antibiotic medicine. What happens during the procedure? An IV will be inserted into one of your veins. You will be given one or more of the following: A medicine to help you relax (sedative). A medicine to numb the area (local anesthetic). A medicine to make you fall asleep (general anesthetic). A medicine that is injected into an area of your body to numb everything below the injection site (regional anesthetic). A lubricating jelly may be placed into your rectum. Your surgeon will insert a short scope (anoscope) into your rectum to examine the hemorrhoids. One of the following surgical methods will be used to remove the hemorrhoids: Closed  hemorrhoidectomy. Your surgeon will use surgical instruments to open the tissue around the hemorrhoids. The veins that supply the hemorrhoids will be tied off with a suture. The hemorrhoids will be removed. The tissue that surrounds the hemorrhoids will be closed with sutures that your body can absorb (absorbable sutures). Open hemorrhoidectomy. The hemorrhoids will be removed with surgical instruments. The incisions will be left open to heal without sutures. Stapled hemorrhoidectomy. Your surgeon will use a circular stapling device to partially remove the hemorrhoids. The device will be inserted into your anus. It will remove a circular ring of tissue that includes hemorrhoid tissue and some tissue above the hemorrhoids. The staples in the device will close the edges of the tissue. This will cut off the blood supply to any remaining hemorrhoids and pull the tissue back into place. Each of these procedures may vary among health care providers and hospitals. What happens after the procedure? Your blood pressure, heart rate, breathing rate, and blood oxygen level may be monitored until you leave the hospital or clinic. You will be given pain medicine as needed. Do not drive for 24 hours if you were given a sedative during your procedure. Summary Surgery may be needed for hemorrhoids if diet changes,  lifestyle changes, and other treatments do not help your symptoms. There are three common methods of surgery that are used to treat hemorrhoids. Follow instructions from your health care provider about taking medicines and about eating and drinking before the procedure. You may be instructed to take a laxative and an enema to clean out your colon before surgery (bowel prep). This information is not intended to replace advice given to you by your health care provider. Make sure you discuss any questions you have with your health care provider. Document Revised: 06/01/2021 Document Reviewed:  06/01/2021 Elsevier Patient Education  2022 [employer] General Anesthesia, Adult, Care After This sheet gives you information about how to care for yourself after your procedure. Your health care provider may also give you more specific instructions. If you have problems or questions, contact your health care provider. What can I expect after the procedure? After the procedure, the following side effects are common: Pain or discomfort at the IV site. Nausea. Vomiting. Sore throat. Trouble concentrating. Feeling cold or chills. Feeling weak or tired. Sleepiness and fatigue. Soreness and body aches. These side effects can affect parts of the body that were not involved in surgery. Follow these instructions at home: For the time period you were told by your health care provider:  Rest. Do not participate in activities where you could fall or become injured. Do not drive or use machinery. Do not drink alcohol. Do not take sleeping pills or medicines that cause drowsiness. Do not make important decisions or sign legal documents. Do not take care of children on your own. Eating and drinking Follow any instructions from your health care provider about eating or drinking restrictions. When you feel hungry, start by eating small amounts of foods that are soft and easy to digest (bland), such as toast. Gradually return to your regular diet. Drink enough fluid to keep your urine pale yellow. If you vomit, rehydrate by drinking water, juice, or clear broth. General instructions If you have sleep apnea, surgery and certain medicines can increase your risk for breathing problems. Follow instructions from your health care provider about wearing your sleep device: Anytime you are sleeping, including during daytime naps. While taking prescription pain medicines, sleeping medicines, or medicines that make you drowsy. Have a responsible adult stay with you for the time you are told. It is  important to have someone help care for you until you are awake and alert. Return to your normal activities as told by your health care provider. Ask your health care provider what activities are safe for you. Take over-the-counter and prescription medicines only as told by your health care provider. If you smoke, do not smoke without supervision. Keep all follow-up visits as told by your health care provider. This is important. Contact a health care provider if: You have nausea or vomiting that does not get better with medicine. You cannot eat or drink without vomiting. You have pain that does not get better with medicine. You are unable to pass urine. You develop a skin rash. You have a fever. You have redness around your IV site that gets worse. Get help right away if: You have difficulty breathing. You have chest pain. You have blood in your urine or stool, or you vomit blood. Summary After the procedure, it is common to have a sore throat or nausea. It is also common to feel tired. Have a responsible adult stay with you for the time you are told. It is important to have  someone help care for you until you are awake and alert. When you feel hungry, start by eating small amounts of foods that are soft and easy to digest (bland), such as toast. Gradually return to your regular diet. Drink enough fluid to keep your urine pale yellow. Return to your normal activities as told by your health care provider. Ask your health care provider what activities are safe for you. This information is not intended to replace advice given to you by your health care provider. Make sure you discuss any questions you have with your health care provider. Document Revised: 08/05/2020 Document Reviewed: 03/04/2020 Elsevier Patient Education  2022 [employer] How to Use Chlorhexidine for Bathing Chlorhexidine gluconate (CHG) is a germ-killing (antiseptic) solution that is used to clean the skin. It can get  rid of the bacteria that normally live on the skin and can keep them away for about 24 hours. To clean your skin with CHG, you may be given: A CHG solution to use in the shower or as part of a sponge bath. A prepackaged cloth that contains CHG. Cleaning your skin with CHG may help lower the risk for infection: While you are staying in the intensive care unit of the hospital. If you have a vascular access, such as a central line, to provide short-term or long-term access to your veins. If you have a catheter to drain urine from your bladder. If you are on a ventilator. A ventilator is a machine that helps you breathe by moving air in and out of your lungs. After surgery. What are the risks? Risks of using CHG include: A skin reaction. Hearing loss, if CHG gets in your ears and you have a perforated eardrum. Eye injury, if CHG gets in your eyes and is not rinsed out. The CHG product catching fire. Make sure that you avoid smoking and flames after applying CHG to your skin. Do not use CHG: If you have a chlorhexidine allergy or have previously reacted to chlorhexidine. On babies younger than 53 months of age. How to use CHG solution Use CHG only as told by your health care provider, and follow the instructions on the label. Use the full amount of CHG as directed. Usually, this is one bottle. During a shower Follow these steps when using CHG solution during a shower (unless your health care provider gives you different instructions): Start the shower. Use your normal soap and shampoo to wash your face and hair. Turn off the shower or move out of the shower stream. Pour the CHG onto a clean washcloth. Do not use any type of brush or rough-edged sponge. Starting at your neck, lather your body down to your toes. Make sure you follow these instructions: If you will be having surgery, pay special attention to the part of your body where you will be having surgery. Scrub this area for at least 1  minute. Do not use CHG on your head or face. If the solution gets into your ears or eyes, rinse them well with water. Avoid your genital area. Avoid any areas of skin that have broken skin, cuts, or scrapes. Scrub your back and under your arms. Make sure to wash skin folds. Let the lather sit on your skin for 1-2 minutes or as long as told by your health care provider. Thoroughly rinse your entire body in the shower. Make sure that all body creases and crevices are rinsed well. Dry off with a clean towel. Do not put any substances  on your body afterward--such as powder, lotion, or perfume--unless you are told to do so by your health care provider. Only use lotions that are recommended by the manufacturer. Put on clean clothes or pajamas. If it is the night before your surgery, sleep in clean sheets.  During a sponge bath Follow these steps when using CHG solution during a sponge bath (unless your health care provider gives you different instructions): Use your normal soap and shampoo to wash your face and hair. Pour the CHG onto a clean washcloth. Starting at your neck, lather your body down to your toes. Make sure you follow these instructions: If you will be having surgery, pay special attention to the part of your body where you will be having surgery. Scrub this area for at least 1 minute. Do not use CHG on your head or face. If the solution gets into your ears or eyes, rinse them well with water. Avoid your genital area. Avoid any areas of skin that have broken skin, cuts, or scrapes. Scrub your back and under your arms. Make sure to wash skin folds. Let the lather sit on your skin for 1-2 minutes or as long as told by your health care provider. Using a different clean, wet washcloth, thoroughly rinse your entire body. Make sure that all body creases and crevices are rinsed well. Dry off with a clean towel. Do not put any substances on your body afterward--such as powder, lotion, or  perfume--unless you are told to do so by your health care provider. Only use lotions that are recommended by the manufacturer. Put on clean clothes or pajamas. If it is the night before your surgery, sleep in clean sheets. How to use CHG prepackaged cloths Only use CHG cloths as told by your health care provider, and follow the instructions on the label. Use the CHG cloth on clean, dry skin. Do not use the CHG cloth on your head or face unless your health care provider tells you to. When washing with the CHG cloth: Avoid your genital area. Avoid any areas of skin that have broken skin, cuts, or scrapes. Before surgery Follow these steps when using a CHG cloth to clean before surgery (unless your health care provider gives you different instructions): Using the CHG cloth, vigorously scrub the part of your body where you will be having surgery. Scrub using a back-and-forth motion for 3 minutes. The area on your body should be completely wet with CHG when you are done scrubbing. Do not rinse. Discard the cloth and let the area air-dry. Do not put any substances on the area afterward, such as powder, lotion, or perfume. Put on clean clothes or pajamas. If it is the night before your surgery, sleep in clean sheets.  For general bathing Follow these steps when using CHG cloths for general bathing (unless your health care provider gives you different instructions). Use a separate CHG cloth for each area of your body. Make sure you wash between any folds of skin and between your fingers and toes. Wash your body in the following order, switching to a new cloth after each step: The front of your neck, shoulders, and chest. Both of your arms, under your arms, and your hands. Your stomach and groin area, avoiding the genitals. Your right leg and foot. Your left leg and foot. The back of your neck, your back, and your buttocks. Do not rinse. Discard the cloth and let the area air-dry. Do not put any  substances on your  body afterward--such as powder, lotion, or perfume--unless you are told to do so by your health care provider. Only use lotions that are recommended by the manufacturer. Put on clean clothes or pajamas. Contact a health care provider if: Your skin gets irritated after scrubbing. You have questions about using your solution or cloth. You swallow any chlorhexidine. Call your local poison control center ([phone] in the [place] Get help right away if: Your eyes itch badly, or they become very red or swollen. Your skin itches badly and is red or swollen. Your hearing changes. You have trouble seeing. You have swelling or tingling in your mouth or throat. You have trouble breathing. These symptoms may represent a serious problem that is an emergency. Do not wait to see if the symptoms will go away. Get medical help right away. Call your local emergency services (911 in the [place] Do not drive yourself to the hospital. Summary Chlorhexidine gluconate (CHG) is a germ-killing (antiseptic) solution that is used to clean the skin. Cleaning your skin with CHG may help to lower your risk for infection. You may be given CHG to use for bathing. It may be in a bottle or in a prepackaged cloth to use on your skin. Carefully follow your health care provider's instructions and the instructions on the product label. Do not use CHG if you have a chlorhexidine allergy. Contact your health care provider if your skin gets irritated after scrubbing. This information is not intended to replace advice given to you by your health care provider. Make sure you discuss any questions you have with your health care provider. Document Revised: 01/31/2021 Document Reviewed: 01/31/2021 Elsevier Patient Education  2022 [employer].

## 2021-12-08 ENCOUNTER — Encounter (HOSPITAL_COMMUNITY)
Admission: RE | Admit: 2021-12-08 | Discharge: 2021-12-08 | Disposition: A | Discharge status: Home / Self Care | Payer: Medicaid Other | Source: Ambulatory Visit | Attending: General Surgery | Admitting: General Surgery

## 2021-12-08 ENCOUNTER — Encounter (HOSPITAL_COMMUNITY): Payer: Self-pay

## 2021-12-08 NOTE — Pre-Procedure Instructions (Signed)
Lenna Sciara at Dr Lovell Sheehan office aware that patient was a no show for her preop.

## 2021-12-16 ENCOUNTER — Other Ambulatory Visit: Payer: Self-pay | Admitting: Neurology

## 2021-12-19 NOTE — Telephone Encounter (Signed)
Rx refilled.

## 2021-12-21 NOTE — Patient Instructions (Signed)
Debbie Schwartz  12/21/2021     @PREFPERIOPPHARMACY @   Your procedure is scheduled on  12/26/2021.   Report to [facility] at  0820 A.M.   Call this number if you have problems the morning of surgery:  [phone]   Remember:  Do not eat or drink after midnight.      Take these medicines the morning of surgery with A SIP OF WATER                                           lamictal     Do not wear jewelry, make-up or nail polish.  Do not wear lotions, powders, or perfumes, or deodorant.  Do not shave 48 hours prior to surgery.  Men may shave face and neck.  Do not bring valuables to the hospital.  [facility] is not responsible for any belongings or valuables.  Contacts, dentures or bridgework may not be worn into surgery.  Leave your suitcase in the car.  After surgery it may be brought to your room.  For patients admitted to the hospital, discharge time will be determined by your treatment team.  Patients discharged the day of surgery will not be allowed to drive home and must have someone with them for 24 hours.    Special instructions:   DO NOT smoke tobacco or vape for 24 hours before your procedure.  Please read over the following fact sheets that you were given. Coughing and Deep Breathing, Surgical Site Infection Prevention, Anesthesia Post-op Instructions, and Care and Recovery After Surgery      Surgical Procedures for Hemorrhoids Surgical procedures can be used to treat hemorrhoids. Hemorrhoids are swollen veins that are inside the rectum (internal hemorrhoids) or around the anus (external hemorrhoids). They are caused by increased pressure in the anal area. This pressure may result from straining to have a bowel movement (constipation), diarrhea, pregnancy, obesity, or sitting for long periods of time. Hemorrhoids can cause symptoms such as pain and bleeding. Surgery may be needed if diet changes, lifestyle changes, and other treatments do not  help your symptoms. Common surgical methods that may be used include: Closed hemorrhoidectomy. The hemorrhoids are surgically removed, and the incisions are closed with stitches (sutures). Open hemorrhoidectomy. The hemorrhoids are surgically removed, but the incisions are allowed to heal without sutures. Stapled hemorrhoidectomy. The hemorrhoids are partially removed, and the incisions are closed with staples. Tell a health care provider about: Any allergies you have. All medicines you are taking, including vitamins, herbs, eye drops, creams, and over-the-counter medicines. Any problems you or family members have had with anesthetic medicines. Any blood disorders you have. Any surgeries you have had. Any medical conditions you have. Whether you are pregnant or may be pregnant. What are the risks? Generally, this is a safe procedure. However, problems may occur, including: Infection. Bleeding. Allergic reactions to medicines. Damage to other structures or organs. Pain. Constipation. Difficulty passing urine. Narrowing of the anal canal (stenosis). Difficulty controlling bowel movements (incontinence). Recurring hemorrhoids. A new passage (fistula) that forms between the anus or rectum and another area. What happens before the procedure? Medicines Ask your health care provider about: Changing or stopping your regular medicines. This is especially important if you are taking diabetes medicines or blood thinners. Taking medicines such as aspirin and ibuprofen. These medicines can thin  your blood. Do not take these medicines unless your health care provider tells you to take them. Taking over-the-counter medicines, vitamins, herbs, and supplements. Staying hydrated Follow instructions from your health care provider about hydration, which may include: Up to 2 hours before the procedure - you may continue to drink clear liquids, such as water, clear fruit juice, black coffee, and plain  tea.  Eating and drinking Follow instructions from your health care provider about eating and drinking, which may include: 8 hours before the procedure - stop eating heavy meals or foods, such as meat, fried foods, or fatty foods. 6 hours before the procedure - stop eating light meals or foods, such as toast or cereal. 6 hours before the procedure - stop drinking milk or drinks that contain milk. 2 hours before the procedure - stop drinking clear liquids. General instructions You may need to have a procedure to examine the inside of your colon with a scope (colonoscopy). Your health care provider may do this to make sure that there are no other causes for your bleeding or pain. You may be instructed to take a laxative and an enema to clean out your colon before surgery (bowel prep). Carefully follow instructions from your health care provider about bowel prep. Plan to have someone take you home from the hospital or clinic. Plan to have a responsible adult care for you for at least 24 hours after you leave the hospital or clinic. This is important. Ask your health care provider: How your surgery site will be marked. What steps will be taken to help prevent infection. These may include: Washing skin with a germ-killing soap. Taking antibiotic medicine. What happens during the procedure? An IV will be inserted into one of your veins. You will be given one or more of the following: A medicine to help you relax (sedative). A medicine to numb the area (local anesthetic). A medicine to make you fall asleep (general anesthetic). A medicine that is injected into an area of your body to numb everything below the injection site (regional anesthetic). A lubricating jelly may be placed into your rectum. Your surgeon will insert a short scope (anoscope) into your rectum to examine the hemorrhoids. One of the following surgical methods will be used to remove the hemorrhoids: Closed  hemorrhoidectomy. Your surgeon will use surgical instruments to open the tissue around the hemorrhoids. The veins that supply the hemorrhoids will be tied off with a suture. The hemorrhoids will be removed. The tissue that surrounds the hemorrhoids will be closed with sutures that your body can absorb (absorbable sutures). Open hemorrhoidectomy. The hemorrhoids will be removed with surgical instruments. The incisions will be left open to heal without sutures. Stapled hemorrhoidectomy. Your surgeon will use a circular stapling device to partially remove the hemorrhoids. The device will be inserted into your anus. It will remove a circular ring of tissue that includes hemorrhoid tissue and some tissue above the hemorrhoids. The staples in the device will close the edges of the tissue. This will cut off the blood supply to any remaining hemorrhoids and pull the tissue back into place. Each of these procedures may vary among health care providers and hospitals. What happens after the procedure? Your blood pressure, heart rate, breathing rate, and blood oxygen level may be monitored until you leave the hospital or clinic. You will be given pain medicine as needed. Do not drive for 24 hours if you were given a sedative during your procedure. Summary Surgery may be needed  for hemorrhoids if diet changes, lifestyle changes, and other treatments do not help your symptoms. There are three common methods of surgery that are used to treat hemorrhoids. Follow instructions from your health care provider about taking medicines and about eating and drinking before the procedure. You may be instructed to take a laxative and an enema to clean out your colon before surgery (bowel prep). This information is not intended to replace advice given to you by your health care provider. Make sure you discuss any questions you have with your health care provider. Document Revised: 06/01/2021 Document Reviewed:  06/01/2021 Elsevier Patient Education  2022 [employer] General Anesthesia, Adult, Care After This sheet gives you information about how to care for yourself after your procedure. Your health care provider may also give you more specific instructions. If you have problems or questions, contact your health care provider. What can I expect after the procedure? After the procedure, the following side effects are common: Pain or discomfort at the IV site. Nausea. Vomiting. Sore throat. Trouble concentrating. Feeling cold or chills. Feeling weak or tired. Sleepiness and fatigue. Soreness and body aches. These side effects can affect parts of the body that were not involved in surgery. Follow these instructions at home: For the time period you were told by your health care provider:  Rest. Do not participate in activities where you could fall or become injured. Do not drive or use machinery. Do not drink alcohol. Do not take sleeping pills or medicines that cause drowsiness. Do not make important decisions or sign legal documents. Do not take care of children on your own. Eating and drinking Follow any instructions from your health care provider about eating or drinking restrictions. When you feel hungry, start by eating small amounts of foods that are soft and easy to digest (bland), such as toast. Gradually return to your regular diet. Drink enough fluid to keep your urine pale yellow. If you vomit, rehydrate by drinking water, juice, or clear broth. General instructions If you have sleep apnea, surgery and certain medicines can increase your risk for breathing problems. Follow instructions from your health care provider about wearing your sleep device: Anytime you are sleeping, including during daytime naps. While taking prescription pain medicines, sleeping medicines, or medicines that make you drowsy. Have a responsible adult stay with you for the time you are told. It is  important to have someone help care for you until you are awake and alert. Return to your normal activities as told by your health care provider. Ask your health care provider what activities are safe for you. Take over-the-counter and prescription medicines only as told by your health care provider. If you smoke, do not smoke without supervision. Keep all follow-up visits as told by your health care provider. This is important. Contact a health care provider if: You have nausea or vomiting that does not get better with medicine. You cannot eat or drink without vomiting. You have pain that does not get better with medicine. You are unable to pass urine. You develop a skin rash. You have a fever. You have redness around your IV site that gets worse. Get help right away if: You have difficulty breathing. You have chest pain. You have blood in your urine or stool, or you vomit blood. Summary After the procedure, it is common to have a sore throat or nausea. It is also common to feel tired. Have a responsible adult stay with you for the time you are told.  It is important to have someone help care for you until you are awake and alert. When you feel hungry, start by eating small amounts of foods that are soft and easy to digest (bland), such as toast. Gradually return to your regular diet. Drink enough fluid to keep your urine pale yellow. Return to your normal activities as told by your health care provider. Ask your health care provider what activities are safe for you. This information is not intended to replace advice given to you by your health care provider. Make sure you discuss any questions you have with your health care provider. Document Revised: 08/05/2020 Document Reviewed: 03/04/2020 Elsevier Patient Education  2022 Else[employer] Inc. How to Use Chlorhexidine for Bathing Chlorhexidine gluconate (CHG) is a germ-killing (antiseptic) solution that is used to clean the skin. It can get  rid of the bacteria that normally live on the skin and can keep them away for about 24 hours. To clean your skin with CHG, you may be given: A CHG solution to use in the shower or as part of a sponge bath. A prepackaged cloth that contains CHG. Cleaning your skin with CHG may help lower the risk for infection: While you are staying in the intensive care unit of the hospital. If you have a vascular access, such as a central line, to provide short-term or long-term access to your veins. If you have a catheter to drain urine from your bladder. If you are on a ventilator. A ventilator is a machine that helps you breathe by moving air in and out of your lungs. After surgery. What are the risks? Risks of using CHG include: A skin reaction. Hearing loss, if CHG gets in your ears and you have a perforated eardrum. Eye injury, if CHG gets in your eyes and is not rinsed out. The CHG product catching fire. Make sure that you avoid smoking and flames after applying CHG to your skin. Do not use CHG: If you have a chlorhexidine allergy or have previously reacted to chlorhexidine. On babies younger than 64 months of age. How to use CHG solution Use CHG only as told by your health care provider, and follow the instructions on the label. Use the full amount of CHG as directed. Usually, this is one bottle. During a shower Follow these steps when using CHG solution during a shower (unless your health care provider gives you different instructions): Start the shower. Use your normal soap and shampoo to wash your face and hair. Turn off the shower or move out of the shower stream. Pour the CHG onto a clean washcloth. Do not use any type of brush or rough-edged sponge. Starting at your neck, lather your body down to your toes. Make sure you follow these instructions: If you will be having surgery, pay special attention to the part of your body where you will be having surgery. Scrub this area for at least 1  minute. Do not use CHG on your head or face. If the solution gets into your ears or eyes, rinse them well with water. Avoid your genital area. Avoid any areas of skin that have broken skin, cuts, or scrapes. Scrub your back and under your arms. Make sure to wash skin folds. Let the lather sit on your skin for 1-2 minutes or as long as told by your health care provider. Thoroughly rinse your entire body in the shower. Make sure that all body creases and crevices are rinsed well. Dry off with a clean towel.  Do not put any substances on your body afterward--such as powder, lotion, or perfume--unless you are told to do so by your health care provider. Only use lotions that are recommended by the manufacturer. Put on clean clothes or pajamas. If it is the night before your surgery, sleep in clean sheets.  During a sponge bath Follow these steps when using CHG solution during a sponge bath (unless your health care provider gives you different instructions): Use your normal soap and shampoo to wash your face and hair. Pour the CHG onto a clean washcloth. Starting at your neck, lather your body down to your toes. Make sure you follow these instructions: If you will be having surgery, pay special attention to the part of your body where you will be having surgery. Scrub this area for at least 1 minute. Do not use CHG on your head or face. If the solution gets into your ears or eyes, rinse them well with water. Avoid your genital area. Avoid any areas of skin that have broken skin, cuts, or scrapes. Scrub your back and under your arms. Make sure to wash skin folds. Let the lather sit on your skin for 1-2 minutes or as long as told by your health care provider. Using a different clean, wet washcloth, thoroughly rinse your entire body. Make sure that all body creases and crevices are rinsed well. Dry off with a clean towel. Do not put any substances on your body afterward--such as powder, lotion, or  perfume--unless you are told to do so by your health care provider. Only use lotions that are recommended by the manufacturer. Put on clean clothes or pajamas. If it is the night before your surgery, sleep in clean sheets. How to use CHG prepackaged cloths Only use CHG cloths as told by your health care provider, and follow the instructions on the label. Use the CHG cloth on clean, dry skin. Do not use the CHG cloth on your head or face unless your health care provider tells you to. When washing with the CHG cloth: Avoid your genital area. Avoid any areas of skin that have broken skin, cuts, or scrapes. Before surgery Follow these steps when using a CHG cloth to clean before surgery (unless your health care provider gives you different instructions): Using the CHG cloth, vigorously scrub the part of your body where you will be having surgery. Scrub using a back-and-forth motion for 3 minutes. The area on your body should be completely wet with CHG when you are done scrubbing. Do not rinse. Discard the cloth and let the area air-dry. Do not put any substances on the area afterward, such as powder, lotion, or perfume. Put on clean clothes or pajamas. If it is the night before your surgery, sleep in clean sheets.  For general bathing Follow these steps when using CHG cloths for general bathing (unless your health care provider gives you different instructions). Use a separate CHG cloth for each area of your body. Make sure you wash between any folds of skin and between your fingers and toes. Wash your body in the following order, switching to a new cloth after each step: The front of your neck, shoulders, and chest. Both of your arms, under your arms, and your hands. Your stomach and groin area, avoiding the genitals. Your right leg and foot. Your left leg and foot. The back of your neck, your back, and your buttocks. Do not rinse. Discard the cloth and let the area air-dry. Do not put  any  substances on your body afterward--such as powder, lotion, or perfume--unless you are told to do so by your health care provider. Only use lotions that are recommended by the manufacturer. Put on clean clothes or pajamas. Contact a health care provider if: Your skin gets irritated after scrubbing. You have questions about using your solution or cloth. You swallow any chlorhexidine. Call your local poison control center [phone]) [phone] in the [place] Get help right away if: Your eyes itch badly, or they become very red or swollen. Your skin itches badly and is red or swollen. Your hearing changes. You have trouble seeing. You have swelling or tingling in your mouth or throat. You have trouble breathing. These symptoms may represent a serious problem that is an emergency. Do not wait to see if the symptoms will go away. Get medical help right away. Call your local emergency services (911 in the [place] Do not drive yourself to the hospital. Summary Chlorhexidine gluconate (CHG) is a germ-killing (antiseptic) solution that is used to clean the skin. Cleaning your skin with CHG may help to lower your risk for infection. You may be given CHG to use for bathing. It may be in a bottle or in a prepackaged cloth to use on your skin. Carefully follow your health care provider's instructions and the instructions on the product label. Do not use CHG if you have a chlorhexidine allergy. Contact your health care provider if your skin gets irritated after scrubbing. This information is not intended to replace advice given to you by your health care provider. Make sure you discuss any questions you have with your health care provider. Document Revised: 01/31/2021 Document Reviewed: 01/31/2021 Elsevier Patient Education  2022 [employer].

## 2021-12-22 ENCOUNTER — Encounter (HOSPITAL_COMMUNITY)
Admission: RE | Admit: 2021-12-22 | Discharge: 2021-12-22 | Disposition: A | Discharge status: Home / Self Care | Payer: Medicaid Other | Source: Ambulatory Visit | Attending: General Surgery | Admitting: General Surgery

## 2021-12-22 ENCOUNTER — Encounter (HOSPITAL_COMMUNITY): Payer: Self-pay

## 2021-12-22 DIAGNOSIS — Z419 Encounter for procedure for purposes other than remedying health state, unspecified: Secondary | ICD-10-CM

## 2021-12-22 DIAGNOSIS — Z01812 Encounter for preprocedural laboratory examination: Secondary | ICD-10-CM | POA: Diagnosis present

## 2021-12-22 DIAGNOSIS — Z01818 Encounter for other preprocedural examination: Secondary | ICD-10-CM

## 2021-12-22 LAB — CBC WITH DIFFERENTIAL/PLATELET
Abs Immature Granulocytes: 0.02 10*3/uL (ref 0.00–0.07)
Basophils Absolute: 0 10*3/uL (ref 0.0–0.1)
Basophils Relative: 0 %
Eosinophils Absolute: 0.1 10*3/uL (ref 0.0–0.5)
Eosinophils Relative: 1 %
HCT: 40.6 % (ref 36.0–46.0)
Hemoglobin: 13.5 g/dL (ref 12.0–15.0)
Immature Granulocytes: 0 %
Lymphocytes Relative: 19 %
Lymphs Abs: 1.3 10*3/uL (ref 0.7–4.0)
MCH: 31 pg (ref 26.0–34.0)
MCHC: 33.3 g/dL (ref 30.0–36.0)
MCV: 93.1 fL (ref 80.0–100.0)
Monocytes Absolute: 0.5 10*3/uL (ref 0.1–1.0)
Monocytes Relative: 7 %
Neutro Abs: 4.9 10*3/uL (ref 1.7–7.7)
Neutrophils Relative %: 73 %
Platelets: 523 10*3/uL — ABNORMAL HIGH (ref 150–400)
RBC: 4.36 MIL/uL (ref 3.87–5.11)
RDW: 13.9 % (ref 11.5–15.5)
WBC: 6.8 10*3/uL (ref 4.0–10.5)
nRBC: 0 % (ref 0.0–0.2)

## 2021-12-22 LAB — HCG, SERUM, QUALITATIVE: Preg, Serum: NEGATIVE

## 2021-12-26 ENCOUNTER — Encounter (HOSPITAL_COMMUNITY): Payer: Self-pay | Admitting: General Surgery

## 2021-12-26 ENCOUNTER — Ambulatory Visit (HOSPITAL_COMMUNITY): Payer: Medicaid Other | Admitting: Certified Registered Nurse Anesthetist

## 2021-12-26 ENCOUNTER — Other Ambulatory Visit: Payer: Self-pay

## 2021-12-26 ENCOUNTER — Ambulatory Visit (HOSPITAL_COMMUNITY)
Admission: RE | Admit: 2021-12-26 | Discharge: 2021-12-26 | Disposition: A | Discharge status: Home / Self Care | Payer: Medicaid Other | Source: Ambulatory Visit | Attending: General Surgery | Admitting: General Surgery

## 2021-12-26 ENCOUNTER — Encounter (HOSPITAL_COMMUNITY)
Admission: RE | Disposition: A | Discharge status: Home / Self Care | Payer: Self-pay | Source: Ambulatory Visit | Attending: General Surgery

## 2021-12-26 DIAGNOSIS — A630 Anogenital (venereal) warts: Secondary | ICD-10-CM | POA: Insufficient documentation

## 2021-12-26 DIAGNOSIS — K644 Residual hemorrhoidal skin tags: Secondary | ICD-10-CM | POA: Insufficient documentation

## 2021-12-26 DIAGNOSIS — Z01818 Encounter for other preprocedural examination: Secondary | ICD-10-CM

## 2021-12-26 DIAGNOSIS — F17210 Nicotine dependence, cigarettes, uncomplicated: Secondary | ICD-10-CM | POA: Insufficient documentation

## 2021-12-26 HISTORY — PX: HEMORRHOID SURGERY: SHX153

## 2021-12-26 SURGERY — HEMORRHOIDECTOMY
Anesthesia: General | Site: Rectum

## 2021-12-26 MED ORDER — CHLORHEXIDINE GLUCONATE CLOTH 2 % EX PADS: 6.0000 | MEDICATED_PAD | Freq: Once | CUTANEOUS | Status: DC

## 2021-12-26 MED ORDER — OXYCODONE-ACETAMINOPHEN 5-325 MG PO TABS: 1.0000 | ORAL_TABLET | ORAL | 0 refills | Status: DC | PRN

## 2021-12-26 MED ORDER — BUPIVACAINE LIPOSOME 1.3 % IJ SUSP
INTRAMUSCULAR | Status: AC
  Filled 2021-12-26: qty 20

## 2021-12-26 MED ORDER — MIDAZOLAM HCL 2 MG/2ML IJ SOLN
INTRAMUSCULAR | Status: AC
  Filled 2021-12-26: qty 2

## 2021-12-26 MED ORDER — ONDANSETRON HCL 4 MG/2ML IJ SOLN: 4.0000 mg | Freq: Once | INTRAMUSCULAR | Status: DC | PRN

## 2021-12-26 MED ORDER — LIDOCAINE VISCOUS HCL 2 % MT SOLN
OROMUCOSAL | Status: AC
  Filled 2021-12-26: qty 15

## 2021-12-26 MED ORDER — SODIUM CHLORIDE 0.9 % IV SOLN
INTRAVENOUS | Status: AC
  Filled 2021-12-26: qty 2

## 2021-12-26 MED ORDER — DEXAMETHASONE SODIUM PHOSPHATE 4 MG/ML IJ SOLN
INTRAMUSCULAR | Status: DC | PRN
  Administered 2021-12-26: 10 mg via INTRAVENOUS

## 2021-12-26 MED ORDER — ORAL CARE MOUTH RINSE: 15.0000 mL | Freq: Once | OROMUCOSAL | Status: AC

## 2021-12-26 MED ORDER — FENTANYL CITRATE PF 50 MCG/ML IJ SOSY: 25.0000 ug | PREFILLED_SYRINGE | INTRAMUSCULAR | Status: DC | PRN

## 2021-12-26 MED ORDER — CHLORHEXIDINE GLUCONATE 0.12 % MT SOLN
OROMUCOSAL | Status: AC
  Administered 2021-12-26: 15 mL via OROMUCOSAL
  Filled 2021-12-26: qty 15

## 2021-12-26 MED ORDER — KETOROLAC TROMETHAMINE 30 MG/ML IJ SOLN
30.0000 mg | Freq: Once | INTRAMUSCULAR | Status: AC
  Administered 2021-12-26: 30 mg via INTRAVENOUS
  Filled 2021-12-26: qty 1

## 2021-12-26 MED ORDER — SODIUM CHLORIDE 0.9 % IV SOLN
2.0000 g | INTRAVENOUS | Status: AC
  Administered 2021-12-26: 2 g via INTRAVENOUS
  Filled 2021-12-26: qty 2

## 2021-12-26 MED ORDER — ONDANSETRON HCL 4 MG/2ML IJ SOLN
INTRAMUSCULAR | Status: DC | PRN
Start: 2021-12-26 — End: 2021-12-26
  Administered 2021-12-26: 4 mg via INTRAVENOUS

## 2021-12-26 MED ORDER — OXYCODONE HCL 5 MG PO TABS
ORAL_TABLET | ORAL | Status: AC
  Filled 2021-12-26: qty 1

## 2021-12-26 MED ORDER — OXYCODONE HCL 5 MG PO TABS
5.0000 mg | ORAL_TABLET | Freq: Once | ORAL | Status: AC
  Administered 2021-12-26: 5 mg via ORAL

## 2021-12-26 MED ORDER — FENTANYL CITRATE (PF) 100 MCG/2ML IJ SOLN
INTRAMUSCULAR | Status: AC
  Filled 2021-12-26: qty 2

## 2021-12-26 MED ORDER — LIDOCAINE HCL (CARDIAC) PF 100 MG/5ML IV SOSY
PREFILLED_SYRINGE | INTRAVENOUS | Status: DC | PRN
  Administered 2021-12-26: 80 mg via INTRATRACHEAL

## 2021-12-26 MED ORDER — MIDAZOLAM HCL 5 MG/5ML IJ SOLN
INTRAMUSCULAR | Status: DC | PRN
  Administered 2021-12-26: 2 mg via INTRAVENOUS

## 2021-12-26 MED ORDER — SODIUM CHLORIDE 0.9 % IR SOLN
Status: DC | PRN
  Administered 2021-12-26: 1000 mL

## 2021-12-26 MED ORDER — BUPIVACAINE LIPOSOME 1.3 % IJ SUSP
INTRAMUSCULAR | Status: DC | PRN
  Administered 2021-12-26: 20 mL

## 2021-12-26 MED ORDER — PROPOFOL 10 MG/ML IV BOLUS
INTRAVENOUS | Status: DC | PRN
  Administered 2021-12-26: 50 mg via INTRAVENOUS
  Administered 2021-12-26: 150 mg via INTRAVENOUS

## 2021-12-26 MED ORDER — LACTATED RINGERS IV SOLN: INTRAVENOUS | Status: DC

## 2021-12-26 MED ORDER — SURGILUBE EX GEL
CUTANEOUS | Status: DC | PRN
  Administered 2021-12-26: 1 via TOPICAL

## 2021-12-26 MED ORDER — FENTANYL CITRATE (PF) 100 MCG/2ML IJ SOLN
INTRAMUSCULAR | Status: DC | PRN
  Administered 2021-12-26: 50 ug via INTRAVENOUS
  Administered 2021-12-26: 100 ug via INTRAVENOUS

## 2021-12-26 MED ORDER — CHLORHEXIDINE GLUCONATE 0.12 % MT SOLN: 15.0000 mL | Freq: Once | OROMUCOSAL | Status: AC

## 2021-12-26 MED ORDER — LIDOCAINE VISCOUS HCL 2 % MT SOLN
OROMUCOSAL | Status: DC | PRN
  Administered 2021-12-26: 1

## 2021-12-26 SURGICAL SUPPLY — 27 items
CLOTH BEACON ORANGE TIMEOUT ST (SAFETY) ×3 IMPLANT
COVER LIGHT HANDLE STERIS (MISCELLANEOUS) ×6 IMPLANT
DRAPE HALF SHEET 40X57 (DRAPES) ×3 IMPLANT
DRSG TEGADERM 4X10 (GAUZE/BANDAGES/DRESSINGS) ×3 IMPLANT
ELECT REM PT RETURN 9FT ADLT (ELECTROSURGICAL) ×3
ELECTRODE REM PT RTRN 9FT ADLT (ELECTROSURGICAL) ×1 IMPLANT
GAUZE 4X4 16PLY ~~LOC~~+RFID DBL (SPONGE) ×3 IMPLANT
GAUZE SPONGE 4X4 12PLY STRL (GAUZE/BANDAGES/DRESSINGS) ×5 IMPLANT
GLOVE SURG POLYISO LF SZ7.5 (GLOVE) ×3 IMPLANT
GLOVE SURG UNDER POLY LF SZ7 (GLOVE) ×6 IMPLANT
GOWN STRL REUS W/TWL LRG LVL3 (GOWN DISPOSABLE) ×6 IMPLANT
HEMOSTAT SURGICEL 4X8 (HEMOSTASIS) ×3 IMPLANT
KIT TURNOVER CYSTO (KITS) ×3 IMPLANT
LIGASURE IMPACT 36 18CM CVD LR (INSTRUMENTS) ×2 IMPLANT
MANIFOLD NEPTUNE II (INSTRUMENTS) ×3 IMPLANT
NDL HYPO 21X1.5 SAFETY (NEEDLE) ×1 IMPLANT
NEEDLE HYPO 21X1.5 SAFETY (NEEDLE) ×3 IMPLANT
NS IRRIG 1000ML POUR BTL (IV SOLUTION) ×3 IMPLANT
PACK PERI GYN (CUSTOM PROCEDURE TRAY) ×3 IMPLANT
PAD ARMBOARD 7.5X6 YLW CONV (MISCELLANEOUS) ×3 IMPLANT
PENCIL SMOKE EVACUATOR (MISCELLANEOUS) ×3 IMPLANT
SET BASIN LINEN APH (SET/KITS/TRAYS/PACK) ×3 IMPLANT
SPONGE GAUZE 4X4 12PLY (GAUZE/BANDAGES/DRESSINGS) ×2 IMPLANT
SURGILUBE 2OZ TUBE FLIPTOP (MISCELLANEOUS) ×3 IMPLANT
SUT SILK 0 FSL (SUTURE) ×3 IMPLANT
SUT VIC AB 2-0 CT2 27 (SUTURE) ×3 IMPLANT
SYR 20ML LL LF (SYRINGE) ×4 IMPLANT

## 2021-12-26 NOTE — Anesthesia Preprocedure Evaluation (Signed)
Anesthesia Evaluation  Patient identified by MRN, date of birth, ID band Patient awake    Reviewed: Allergy & Precautions, H&P , NPO status , Patient's Chart, lab work & pertinent test results, reviewed documented beta blocker date and time   Airway Mallampati: II  TM Distance: >3 FB Neck ROM: full    Dental no notable dental hx.    Pulmonary neg pulmonary ROS, Current Smoker,    Pulmonary exam normal breath sounds clear to auscultation       Cardiovascular Exercise Tolerance: Good negative cardio ROS   Rhythm:regular Rate:Normal     Neuro/Psych Seizures -,  PSYCHIATRIC DISORDERS Anxiety Depression    GI/Hepatic negative GI ROS, Neg liver ROS,   Endo/Other  negative endocrine ROS  Renal/GU negative Renal ROS  negative genitourinary   Musculoskeletal   Abdominal   Peds  Hematology negative hematology ROS (+)   Anesthesia Other Findings   Reproductive/Obstetrics negative OB ROS                             Anesthesia Physical Anesthesia Plan  ASA: 2  Anesthesia Plan: General and General LMA   Post-op Pain Management:    Induction:   PONV Risk Score and Plan:   Airway Management Planned:   Additional Equipment:   Intra-op Plan:   Post-operative Plan:   Informed Consent: I have reviewed the patients History and Physical, chart, labs and discussed the procedure including the risks, benefits and alternatives for the proposed anesthesia with the patient or authorized representative who has indicated his/her understanding and acceptance.     Dental Advisory Given  Plan Discussed with: CRNA  Anesthesia Plan Comments:         Anesthesia Quick Evaluation

## 2021-12-26 NOTE — Anesthesia Postprocedure Evaluation (Signed)
Anesthesia Post Note  Patient: Debbie Schwartz  Procedure(s) Performed: HEMORRHOIDECTOMY; EXTENSIVE (Rectum)  Patient location during evaluation: Phase II Anesthesia Type: General Level of consciousness: awake Pain management: pain level controlled Vital Signs Assessment: post-procedure vital signs reviewed and stable Respiratory status: spontaneous breathing and respiratory function stable Cardiovascular status: blood pressure returned to baseline and stable Postop Assessment: no headache and no apparent nausea or vomiting Anesthetic complications: no Comments: Late entry   No notable events documented.   Last Vitals:  Vitals:   12/26/21 1000 12/26/21 1023  BP: 106/67 104/73  Pulse: 92 87  Resp: 17 18  Temp:  (!) 36.4 C  SpO2: 98% 98%    Last Pain:  Vitals:   12/26/21 1023  TempSrc: Axillary  PainSc: [place]

## 2021-12-26 NOTE — Op Note (Signed)
Patient:  Debbie Schwartz  DOB:  Oct 27, 1986  MRN:  [id number]   Preop Diagnosis: Hemorrhoidal disease  Postop Diagnosis: Condyloma acuminata  Procedure: Rectal examination under anesthesia, excision and destruction of condyloma  Surgeon: Aviva Signs, MD  Anes: General  Indications: Patient is a 36 year old white female with a history of hemorrhoidal disease who originally had canceled her surgery in December 2022 who now presents for her surgery.  She states the previously concerning hemorrhoid has resolved but she still has hemorrhoidal skin tags that she would like removed.  Risks and benefits of the procedure were fully explained to the patient, who gave informed consent.  Procedure note: The patient was placed in the lithotomy position after general anesthesia was administered.  The perineum was prepped and draped using the usual sterile technique with Betadine.  Surgical site confirmation was performed.  On anoscopy, no significant internal and external hemorrhoidal disease was noted.  The patient had 3 condylomatous lesions at the 6:00, 9:00, and 12:00 positions.  All were excised using the LigaSure without difficulty.  They were sent to pathology for further examination.  Exparel was instilled into the surrounding perineum.  Surgicel and viscous Xylocaine rectal packing was placed.  All tape and needle counts were correct at the end of the procedure.  The patient was extubated in the operating room and transferred to PACU in stable condition.  Complications: None  EBL: Minimal  Specimen: Condyloma

## 2021-12-26 NOTE — Transfer of Care (Signed)
Immediate Anesthesia Transfer of Care Note  Patient: Debbie Schwartz  Procedure(s) Performed: HEMORRHOIDECTOMY; EXTENSIVE (Rectum)  Patient Location: PACU  Anesthesia Type:General  Level of Consciousness: awake, alert  and oriented  Airway & Oxygen Therapy: Patient Spontanous Breathing  Post-op Assessment: Report given to RN and Post -op Vital signs reviewed and stable  Post vital signs: Reviewed and stable  Last Vitals:  Vitals Value Taken Time  BP 122/100 12/26/21 0942  Temp    Pulse 97 12/26/21 0943  Resp 15 12/26/21 0943  SpO2 100 % 12/26/21 0943  Vitals shown include unvalidated device data.  Last Pain:  Vitals:   12/26/21 0905  TempSrc: Oral  PainSc:       Patients Stated Pain Goal: 7 (12/26/21 0859)  Complications: No notable events documented.

## 2021-12-26 NOTE — Interval H&P Note (Signed)
History and Physical Interval Note:  12/26/2021 9:03 AM  Debbie Schwartz  has presented today for surgery, with the diagnosis of Thrombosed hemorrhoids.  The various methods of treatment have been discussed with the patient and family. After consideration of risks, benefits and other options for treatment, the patient has consented to  Procedure(s): HEMORRHOIDECTOMY; EXTENSIVE (N/A) as a surgical intervention.  The patient's history has been reviewed, patient examined, no change in status, stable for surgery.  I have reviewed the patient's chart and labs.  Questions were answered to the patient's satisfaction.     [name]

## 2021-12-26 NOTE — H&P (Signed)
Debbie Schwartz; [id number]; 08/18/1986   HPI Patient is a 36 year old white female who was referred to my care by Abran Richard, MD for evaluation and treatment of a thrombosed hemorrhoid.  Patient states she has had the thrombosed hemorrhoid for the past 2 weeks.  She has been using Proctofoam cream and it has shrunk in size.  It is tender to touch but not bleeding.  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  She has never had a hemorrhoidectomy. Past Medical History:  Diagnosis Date   Anxiety    Depression    Seizures ([place])     Past Surgical History:  Procedure Laterality Date   CESAREAN SECTION      Family History  Problem Relation Age of Onset   Diabetes Father     Current Outpatient Medications on File Prior to Visit  Medication Sig Dispense Refill   hydrocortisone-pramoxine (PROCTOFOAM HC) rectal foam 1 application     lamoTRIgine (LAMICTAL XR) 100 MG 24 hour tablet TAKE (3) TABLETS BY MOUTH ONCE DAILY. 90 tablet 0   No current facility-administered medications on file prior to visit.    No Known Allergies  Social History   Substance and Sexual Activity  Alcohol Use Yes   Comment: occasionally     Social History   Tobacco Use  Smoking Status Every Day   Packs/day: 0.50   Types: Cigarettes  Smokeless Tobacco Never    Review of Systems  Constitutional: Negative.   HENT: Negative.    Eyes: Negative.   Respiratory: Negative.    Cardiovascular: Negative.   Gastrointestinal: Negative.   Genitourinary: Negative.   Musculoskeletal: Negative.   Skin: Negative.   Neurological:  Positive for seizures.  Endo/Heme/Allergies: Negative.   Psychiatric/Behavioral:  The patient is nervous/anxious.    Objective   Vitals:   11/17/21 1023  BP: 100/66  Pulse: 89  Resp: 16  Temp: 98.5 F (36.9 C)  SpO2: 98%    Physical Exam Vitals reviewed.  Constitutional:      Appearance: Normal appearance. She is normal weight. She is not ill-appearing.   HENT:     Head: Normocephalic and atraumatic.  Cardiovascular:     Rate and Rhythm: Normal rate and regular rhythm.     Heart sounds: Normal heart sounds. No murmur heard.   No friction rub. No gallop.  Pulmonary:     Effort: Pulmonary effort is normal. No respiratory distress.     Breath sounds: Normal breath sounds. No stridor. No wheezing, rhonchi or rales.  Genitourinary:    Comments: Thrombosed hemorrhoid with clot exposed at the 7 o'clock position.  Multiple skin tags present.  Examination limited secondary to pain.  No active bleeding is noted. Skin:    General: Skin is warm and dry.  Neurological:     Mental Status: She is alert and oriented to person, place, and time.   Primary care notes reviewed Assessment  Thrombosed hemorrhoid, external hemorrhoidal skin tags Plan  I did offer evacuation of the clot in the office, but the patient would prefer formal excision.  The patient is scheduled for a hemorrhoidectomy on 11/25/2021.  The risks and benefits of the procedure including bleeding, infection, pain, and the possibility of recurrence of the hemorrhoidal disease were fully explained to the patient, who gave informed consent.  Addendum:  Patient had to reschedule surgery for 12/26/21.  No changes in H/P.

## 2021-12-26 NOTE — Interval H&P Note (Signed)
History and Physical Interval Note:  12/26/2021 8:58 AM  Debbie Schwartz  has presented today for surgery, with the diagnosis of Thrombosed hemorrhoids.  The various methods of treatment have been discussed with the patient and family. After consideration of risks, benefits and other options for treatment, the patient has consented to  Procedure(s): HEMORRHOIDECTOMY; EXTENSIVE (N/A) as a surgical intervention.  The patient's history has been reviewed, patient examined, no change in status, stable for surgery.  I have reviewed the patient's chart and labs.  Questions were answered to the patient's satisfaction.     Aviva Signs

## 2021-12-26 NOTE — Anesthesia Procedure Notes (Signed)
Procedure Name: LMA Insertion Date/Time: 12/26/2021 9:17 AM Performed by: Darryl Nestle, CRNA Pre-anesthesia Checklist: Patient identified, Emergency Drugs available, Suction available and Patient being monitored Patient Re-evaluated:Patient Re-evaluated prior to induction Oxygen Delivery Method: Circle system utilized Preoxygenation: Pre-oxygenation with 100% oxygen Induction Type: IV induction Ventilation: Mask ventilation without difficulty LMA Size: 3.5 Tube type: Oral Number of attempts: 1 Placement Confirmation: positive ETCO2 and breath sounds checked- equal and bilateral Tube secured with: Tape Dental Injury: Teeth and Oropharynx as per pre-operative assessment

## 2021-12-27 ENCOUNTER — Encounter (HOSPITAL_COMMUNITY): Payer: Self-pay | Admitting: General Surgery

## 2021-12-27 LAB — SURGICAL PATHOLOGY

## 2022-01-03 ENCOUNTER — Other Ambulatory Visit: Payer: Self-pay

## 2022-01-03 ENCOUNTER — Encounter: Payer: Self-pay | Admitting: General Surgery

## 2022-01-03 ENCOUNTER — Ambulatory Visit (INDEPENDENT_AMBULATORY_CARE_PROVIDER_SITE_OTHER): Payer: Medicaid Other | Admitting: General Surgery

## 2022-01-03 VITALS — BP 112/75 | HR 95 | Temp 97.4°F | Resp 12 | Ht 64.0 in | Wt 136.0 lb

## 2022-01-03 DIAGNOSIS — Z09 Encounter for follow-up examination after completed treatment for conditions other than malignant neoplasm: Secondary | ICD-10-CM

## 2022-01-03 NOTE — Progress Notes (Signed)
Subjective:     Debbie Schwartz  Patient here for postoperative visit, status post excision of condyloma on anus.  She is doing well.  She has minimal incisional pain. Objective:    BP 112/75    Pulse 95    Temp (!) 97.4 F (36.3 C) (Other (Comment))    Resp 12    Ht 5\' 4"  (1.626 m)    Wt 136 lb (61.7 kg)    LMP 12/05/2021    SpO2 99%    BMI 23.34 kg/m   General:  alert, cooperative, and no distress  Anus is healing well. Final pathology revealed condyloma acuminata     Assessment:    Doing well postoperatively.    Plan:   Patient and husband were told of diagnosis.  Literature was given.  Follow-up as needed.

## 2022-01-19 ENCOUNTER — Other Ambulatory Visit: Payer: Self-pay | Admitting: Neurology

## 2022-01-23 ENCOUNTER — Ambulatory Visit: Payer: Medicaid Other | Admitting: Neurology

## 2022-02-21 ENCOUNTER — Other Ambulatory Visit: Payer: Self-pay | Admitting: Neurology

## 2022-02-21 NOTE — Telephone Encounter (Signed)
Rx refilled for 30 day supply

## 2022-02-28 ENCOUNTER — Ambulatory Visit: Payer: Medicaid Other | Admitting: Neurology

## 2022-02-28 NOTE — Progress Notes (Deleted)
? ? ?PATIENT: Debbie Schwartz ?DOB: 15-Mar-1986 ? ?REASON FOR VISIT: follow up for seizures ?HISTORY FROM: patient ?PRIMARY NEUROLOGIST: Dr. Krista Blue  ? ?HISTORY OF PRESENT ILLNESS:Debbie Schwartz is 36 years old right-handed Caucasian female follow-up for seizure, ?  ?She had seizures since 2010, had first seizure was in October 15 2009, generalized tonic-clonic seizure, CAT scan was essentially normal, MRI of the brain, and EEG was normal as well. ?  ?She had recurrent seizure on November 08 2009, was loaded with Keppra at emergency room, later due to her complains of mood disorder, she was switched to Lamictal, currently taking lamotrigin 100 mg 3 tablets every night, tolerating well, ?  ?She went through normal pregnancy twice, while taking lamotrigine, reported last seizure was in 2013. ?  ?UPDATE Aug 26 2015: ?She has no recurrent seizure, was taken to the emergency room October 2015 for an anxiety episode, happened during her psychotherapy section, with hyperventilation, bilateral hands numbness, posturing, ?  ?She tolerating lamotrigine xr 100 mg 3 tablets a day, also taking Latuda, her depression is under better control, ?  ?Today she complains of excessive fatigue, daytime sleepiness, ESS 10, FSS 43. She also has frequent awakening at night time, frequent body jerking movement.  ?  ?UPDATE Oct 25th 2017:YY ?She had one recurrent episode of seizure-like activity during her psychotherapy section for her anxiety disorder, her hands looked up, she became nervous, become confused, but there was no total loss of consciousness, she has been complying with her lamotrigine XL 100 mg 3 tablets every night, this is only medication she is taking. ?  ?UPDATE 10/25/2018CM Debbie Schwartz, 36 year old female returns for follow-up with history of seizure disorder and anxiety. She denies any seizure events in the last year. Sleep deprived EEG was normal . Lamictal level 8.1 after last visit which is therapeutic. She denies side  effects to Lamictal. She is currently taking 300 mg Schwartz. She was going to a psychotherapist for her anxiety disorder but stopped going when she was told she was bipolar.She returns for reevaluation. She denies any interval medical issues. She does not exercise ?UPDATE 12/18/2019CM Debbie Schwartz, 36 year old female returns for follow-up with history of seizure disorder and anxiety.  She denies any seizure events in the last 2 to 3 years.  Sleep deprived EEG in the past was normal she is currently on Lamictal 300 mg Schwartz without side effects.  She stopped going to her psychotherapist for her anxiety disorder when he told her she was bipolar.  She denies any interval medical issues.  She does not exercise.  She returns for reevaluation.  She needs refills on her medication.  ?  ?Update November 12, 2019 SS: Debbie Schwartz is a 36 year old female with history of seizure disorder and anxiety.  She Debbie Schwartz.  She denies recurrent seizure.  She is tolerating medication without side effect.  She reports continued anxiety.  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  She presents today for evaluation accompanied by her daughter. ? ?Update January 20, 2021 SS: Debbie Schwartz.  Tolerating well, no recurrent seizure in about 10 years.  She has 3 children, does not work.  No changes to medical history, sees PCP, gets labs. ? ?Update February 28, 2022 SS:  ? ?REVIEW OF SYSTEMS: Out of a complete 14 system review of symptoms, the patient complains only of the following symptoms, and all other reviewed systems are negative. ? ?  See HPI ? ?ALLERGIES: ?No Known Allergies ? ?HOME MEDICATIONS: ?Outpatient Medications Prior to Visit  ?Medication Sig Dispense Refill  ? hydrocortisone-pramoxine (PROCTOFOAM HC) rectal foam Place 1 applicator rectally 3 (three) times Schwartz. (Patient not taking: Reported on 01/03/2022)    ? lamoTRIgine (LAMICTAL XR) 100 MG 24 hour tablet TAKE (3)  TABLETS BY MOUTH ONCE Schwartz. 90 tablet 0  ? naproxen sodium (ALEVE) 220 MG tablet Take 220 mg by mouth Schwartz as needed (pain/headache).    ? ?No facility-administered medications prior to visit.  ? ? ?PAST MEDICAL HISTORY: ?Past Medical History:  ?Diagnosis Date  ? Anxiety   ? Depression   ? Seizures ([place])   ? ? ?PAST SURGICAL HISTORY: ?Past Surgical History:  ?Procedure Laterality Date  ? CESAREAN SECTION    ? HEMORRHOID SURGERY N/A 12/26/2021  ? Procedure: HEMORRHOIDECTOMY; EXTENSIVE;  Surgeon: Aviva Signs, MD;  Location: [facility];  Service: General;  Laterality: N/A;  ? ? ?FAMILY HISTORY: ?Family History  ?Problem Relation Age of Onset  ? Diabetes Father   ? ? ?SOCIAL HISTORY: ?Social History  ? ?Socioeconomic History  ? Marital status: Married  ?  Spouse name: Not on file  ? Number of children: 2  ? Years of education: Not on file  ? Highest education level: Not on file  ?Occupational History  ? Not on file  ?Tobacco Use  ? Smoking status: Every Day  ?  Packs/day: 0.50  ?  Types: Cigarettes  ? Smokeless tobacco: Never  ?Vaping Use  ? Vaping Use: Former  ?Substance and Sexual Activity  ? Alcohol use: Yes  ?  Comment: occasionally   ? Drug use: Yes  ?  Types: Marijuana  ?  Comment: last use yesterday  ? Sexual activity: Yes  ?  Birth control/protection: None  ?Other Topics Concern  ? Not on file  ?Social History Narrative  ? Not on file  ? ?Social Determinants of Health  ? ?Financial Resource Strain: Not on file  ?Food Insecurity: Not on file  ?Transportation Needs: Not on file  ?Physical Activity: Not on file  ?Stress: Not on file  ?Social Connections: Not on file  ?Intimate Partner Violence: Not on file  ? ?PHYSICAL EXAM ? ?There were no vitals filed for this visit. ? ?There is no height or weight on file to calculate BMI. ? ?Generalized: Well developed, in no acute distress  ? ?Neurological examination  ?Mentation: Alert oriented to time, place, history taking. Follows all commands speech and language  fluent ?Cranial nerve II-XII: Pupils were equal round reactive to light. Extraocular movements were full, visual field were full on confrontational test. Facial sensation and strength were normal. Head turning and shoulder shrug  were normal and symmetric. ?Motor: The motor testing reveals 5 over 5 strength of all 4 extremities. Good symmetric motor tone is noted throughout.  ?Sensory: Sensory testing is intact to soft touch on all 4 extremities. No evidence of extinction is noted.  ?Coordination: Cerebellar testing reveals good finger-nose-finger and heel-to-shin bilaterally.  ?Gait and station: Gait is normal. ?Reflexes: Deep tendon reflexes are symmetric and normal bilaterally.  ? ?DIAGNOSTIC DATA (LABS, IMAGING, TESTING) ?- I reviewed patient records, labs, notes, testing and imaging myself where available. ? ?Lab Results  ?Component Value Date  ? WBC 6.8 12/22/2021  ? HGB 13.5 12/22/2021  ? HCT 40.6 12/22/2021  ? MCV 93.1 12/22/2021  ? PLT 523 (H) 12/22/2021  ? ?   ?Component Value Date/Time  ? NA 141 11/12/2019 1411  ?  K 4.4 11/12/2019 1411  ? CL 104 11/12/2019 1411  ? CO2 24 11/12/2019 1411  ? GLUCOSE 72 11/12/2019 1411  ? GLUCOSE 90 09/05/2018 1112  ? BUN 5 (L) 11/12/2019 1411  ? CREATININE 0.73 11/12/2019 1411  ? CALCIUM 10.0 11/12/2019 1411  ? PROT 6.8 11/12/2019 1411  ? ALBUMIN 4.4 11/12/2019 1411  ? AST 15 11/12/2019 1411  ? ALT 15 11/12/2019 1411  ? ALKPHOS 78 11/12/2019 1411  ? BILITOT <0.2 11/12/2019 1411  ? GFRNONAA 109 11/12/2019 1411  ? GFRAA 126 11/12/2019 1411  ? ?No results found for: CHOL, HDL, LDLCALC, LDLDIRECT, TRIG, CHOLHDL ?No results found for: HGBA1C ?No results found for: VITAMINB12 ?No results found for: TSH ? ?ASSESSMENT AND PLAN ?36 y.o. year old female  has a past medical history of Anxiety, Depression, and Seizures ([place]). here with: ? ?1.  Seizures ?-No recurrent seizure ?-Continue lamotrigine XR 100 mg tablet, 3 tablets at bedtime ?-Doesn't want labs today, PCP can check, Lamictal  level was 7.11 November 2019 ?-Call for recurrent seizure, otherwise follow-up 1 year or sooner if needed ? ? ? ?Butler Denmark, AGNP-C, DNP 02/28/2022, 5:55 AM ?[facility] ?[place]

## 2022-03-01 ENCOUNTER — Encounter: Payer: Self-pay | Admitting: Neurology

## 2022-03-01 ENCOUNTER — Ambulatory Visit: Payer: Medicaid Other | Admitting: Neurology

## 2022-03-01 ENCOUNTER — Other Ambulatory Visit: Payer: Self-pay

## 2022-03-01 VITALS — BP 102/66 | HR 101 | Ht 64.0 in | Wt 138.5 lb

## 2022-03-01 DIAGNOSIS — M549 Dorsalgia, unspecified: Secondary | ICD-10-CM | POA: Insufficient documentation

## 2022-03-01 DIAGNOSIS — M7520 Bicipital tendinitis, unspecified shoulder: Secondary | ICD-10-CM | POA: Insufficient documentation

## 2022-03-01 DIAGNOSIS — R569 Unspecified convulsions: Secondary | ICD-10-CM | POA: Diagnosis not present

## 2022-03-01 DIAGNOSIS — D75839 Thrombocytosis, unspecified: Secondary | ICD-10-CM | POA: Insufficient documentation

## 2022-03-01 DIAGNOSIS — S161XXA Strain of muscle, fascia and tendon at neck level, initial encounter: Secondary | ICD-10-CM | POA: Insufficient documentation

## 2022-03-01 MED ORDER — LAMOTRIGINE ER 100 MG PO TB24: ORAL_TABLET | ORAL | 11 refills | Status: DC

## 2022-03-01 NOTE — Progress Notes (Signed)
? ? ?PATIENT: Debbie Schwartz ?DOB: [birth year] ? ?REASON FOR VISIT: follow up for seizures ?HISTORY FROM: patient, husband ?PRIMARY NEUROLOGIST: Dr. Terrace Arabia  ? ?HISTORY OF PRESENT ILLNESS:Debbie Schwartz is 36 years old right-handed Caucasian female follow-up for seizure, ?  ?She had seizures since 2010, had first seizure was in October 15 2009, generalized tonic-clonic seizure, CAT scan was essentially normal, MRI of the brain, and EEG was normal as well. ?  ?She had recurrent seizure on November 08 2009, was loaded with Keppra at emergency room, later due to her complains of mood disorder, she was switched to Lamictal, currently taking lamotrigin 100 mg 3 tablets every night, tolerating well, ?  ?She went through normal pregnancy twice, while taking lamotrigine, reported last seizure was in 2013. ?  ?UPDATE Aug 26 2015: ?She has no recurrent seizure, was taken to the emergency room October 2015 for an anxiety episode, happened during her psychotherapy section, with hyperventilation, bilateral hands numbness, posturing, ?  ?She tolerating lamotrigine xr 100 mg 3 tablets a day, also taking Latuda, her depression is under better control, ?  ?Today she complains of excessive fatigue, daytime sleepiness, ESS 10, FSS 43. She also has frequent awakening at night time, frequent body jerking movement.  ?  ?UPDATE Oct 25th 2017:YY ?She had one recurrent episode of seizure-like activity during her psychotherapy section for her anxiety disorder, her hands looked up, she became nervous, become confused, but there was no total loss of consciousness, she has been complying with her lamotrigine XL 100 mg 3 tablets every night, this is only medication she is taking. ?  ?UPDATE 10/25/2018CM Debbie Schwartz, 36 year old female returns for follow-up with history of seizure disorder and anxiety. She denies any seizure events in the last year. Sleep deprived EEG was normal . Lamictal level 8.1 after last visit which is therapeutic. She  denies side effects to Lamictal. She is currently taking 300 mg daily. She was going to a psychotherapist for her anxiety disorder but stopped going when she was told she was bipolar.She returns for reevaluation. She denies any interval medical issues. She does not exercise ?UPDATE 12/18/2019CM Debbie Schwartz, 36 year old female returns for follow-up with history of seizure disorder and anxiety.  She denies any seizure events in the last 2 to 3 years.  Sleep deprived EEG in the past was normal she is currently on Lamictal 300 mg daily without side effects.  She stopped going to her psychotherapist for her anxiety disorder when he told her she was bipolar.  She denies any interval medical issues.  She does not exercise.  She returns for reevaluation.  She needs refills on her medication.  ?  ?Update November 12, 2019 SS: Debbie Schwartz is a 37 year old female with history of seizure disorder and anxiety.  She remains on lamotrigine XR 300 mg daily.  She denies recurrent seizure.  She is tolerating medication without side effect.  She reports continued anxiety.  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  She presents today for evaluation accompanied by her daughter. ? ?Update January 20, 2021 SS: Remains on lamotrigine XR 300 mg daily.  Tolerating well, no recurrent seizure in about 10 years.  She has 3 children, does not work.  No changes to medical history, sees PCP, gets labs. ? ?Update February 28, 2022 SS: No seizures, remains on Lamictal XR 100 mg, 3 tablets daily. Last seizure was 12 years ago. Works part-time doing taxes, does this well, enjoys working. Sleeps fairly well  at night, occasional snoring, hard for her to get going in the morning, daytime fatigue. No longer seeing psychiatry. ? ?REVIEW OF SYSTEMS: Out of a complete 14 system review of symptoms, the patient complains only of the following symptoms, and all other reviewed systems are negative. ? ?See HPI ? ?ALLERGIES: ?No Known  Allergies ? ?HOME MEDICATIONS: ?Outpatient Medications Prior to Visit  ?Medication Sig Dispense Refill  ? naproxen sodium (ALEVE) 220 MG tablet Take 220 mg by mouth daily as needed.    ? lamoTRIgine (LAMICTAL XR) 100 MG 24 hour tablet TAKE (3) TABLETS BY MOUTH ONCE DAILY. 90 tablet 0  ? hydrocortisone-pramoxine (PROCTOFOAM HC) rectal foam Place 1 applicator rectally 3 (three) times daily. (Patient not taking: Reported on 01/03/2022)    ? naproxen sodium (ALEVE) 220 MG tablet Take 220 mg by mouth daily as needed (pain/headache).    ? ?No facility-administered medications prior to visit.  ? ? ?PAST MEDICAL HISTORY: ?Past Medical History:  ?Diagnosis Date  ? Anxiety   ? Depression   ? Seizures ([facility])   ? ? ?PAST SURGICAL HISTORY: ?Past Surgical History:  ?Procedure Laterality Date  ? CESAREAN SECTION    ? HEMORRHOID SURGERY N/A 12/26/2021  ? Procedure: HEMORRHOIDECTOMY; EXTENSIVE;  Surgeon: [name], MD;  Location: [facility];  Service: General;  Laterality: N/A;  ? ? ?FAMILY HISTORY: ?Family History  ?Problem Relation Age of Onset  ? Diabetes Father   ? ? ?SOCIAL HISTORY: ?Social History  ? ?Socioeconomic History  ? Marital status: Married  ?  Spouse name: Not on file  ? Number of children: 2  ? Years of education: Not on file  ? Highest education level: Not on file  ?Occupational History  ? Not on file  ?Tobacco Use  ? Smoking status: Every Day  ?  Packs/day: 0.50  ?  Types: Cigarettes  ? Smokeless tobacco: Never  ?Vaping Use  ? Vaping Use: Former  ?Substance and Sexual Activity  ? Alcohol use: Yes  ?  Comment: occasionally   ? Drug use: Yes  ?  Types: Marijuana  ?  Comment: last use yesterday  ? Sexual activity: Yes  ?  Birth control/protection: None  ?Other Topics Concern  ? Not on file  ?Social History Narrative  ? Not on file  ? ?Social Determinants of Health  ? ?Financial Resource Strain: Not on file  ?Food Insecurity: Not on file  ?Transportation Needs: Not on file  ?Physical Activity: Not on file  ?Stress: Not  on file  ?Social Connections: Not on file  ?Intimate Partner Violence: Not on file  ? ?PHYSICAL EXAM ? ?Vitals:  ? 03/01/22 1037  ?BP: 102/66  ?Pulse: (!) 101  ?Weight: 138 lb 8 oz (62.8 kg)  ?Height: 5\' 4"  (1.626 m)  ? ? ?Body mass index is 23.77 kg/m?. ? ?Generalized: Well developed, in no acute distress  ? ?Neurological examination  ?Mentation: Alert oriented to time, place, history taking. Follows all commands speech and language fluent ?Cranial nerve II-XII: Pupils were equal round reactive to light. Extraocular movements were full, visual field were full on confrontational test. Facial sensation and strength were normal. Head turning and shoulder shrug were normal and symmetric. ?Motor: The motor testing reveals 5 over 5 strength of all 4 extremities. Good symmetric motor tone is noted throughout.  ?Sensory: Sensory testing is intact to soft touch on all 4 extremities. No evidence of extinction is noted.  ?Coordination: Cerebellar testing reveals good finger-nose-finger and heel-to-shin bilaterally.  ?Gait and  station: Gait is normal.  Tandem gait is normal. ?Reflexes: Deep tendon reflexes are symmetric and normal bilaterally.  ? ?DIAGNOSTIC DATA (LABS, IMAGING, TESTING) ?- I reviewed patient records, labs, notes, testing and imaging myself where available. ? ?Lab Results  ?Component Value Date  ? WBC 6.8 12/22/2021  ? HGB 13.5 12/22/2021  ? HCT 40.6 12/22/2021  ? MCV 93.1 12/22/2021  ? PLT 523 (H) 12/22/2021  ? ?   ?Component Value Date/Time  ? NA 141 11/12/2019 1411  ? K 4.4 11/12/2019 1411  ? CL 104 11/12/2019 1411  ? CO2 24 11/12/2019 1411  ? GLUCOSE 72 11/12/2019 1411  ? GLUCOSE 90 09/05/2018 1112  ? BUN 5 (L) 11/12/2019 1411  ? CREATININE 0.73 11/12/2019 1411  ? CALCIUM 10.0 11/12/2019 1411  ? PROT 6.8 11/12/2019 1411  ? ALBUMIN 4.4 11/12/2019 1411  ? AST 15 11/12/2019 1411  ? ALT 15 11/12/2019 1411  ? ALKPHOS 78 11/12/2019 1411  ? BILITOT <0.2 11/12/2019 1411  ? GFRNONAA 109 11/12/2019 1411  ? GFRAA 126  11/12/2019 1411  ? ?No results found for: CHOL, HDL, LDLCALC, LDLDIRECT, TRIG, CHOLHDL ?No results found for: HGBA1C ?No results found for: VITAMINB12 ?No results found for: TSH ? ?ASSESSMENT AND PLAN ?36 y.o. year old

## 2022-03-01 NOTE — Patient Instructions (Signed)
Continue Lamictal  ?Call for seizures ?Check labs today  ?See you back in 1 year  ?

## 2022-03-02 ENCOUNTER — Telehealth: Payer: Self-pay | Admitting: Neurology

## 2022-03-02 LAB — COMPREHENSIVE METABOLIC PANEL
ALT: 12 IU/L (ref 0–32)
AST: 19 IU/L (ref 0–40)
Albumin/Globulin Ratio: 2.1 (ref 1.2–2.2)
Albumin: 4.7 g/dL (ref 3.8–4.8)
Alkaline Phosphatase: 87 IU/L (ref 44–121)
BUN/Creatinine Ratio: 10 (ref 9–23)
BUN: 7 mg/dL (ref 6–20)
Bilirubin Total: 0.2 mg/dL (ref 0.0–1.2)
CO2: 24 mmol/L (ref 20–29)
Calcium: 9.4 mg/dL (ref 8.7–10.2)
Chloride: 98 mmol/L (ref 96–106)
Creatinine, Ser: 0.7 mg/dL (ref 0.57–1.00)
Globulin, Total: 2.2 g/dL (ref 1.5–4.5)
Glucose: 80 mg/dL (ref 70–99)
Potassium: 4.1 mmol/L (ref 3.5–5.2)
Sodium: 136 mmol/L (ref 134–144)
Total Protein: 6.9 g/dL (ref 6.0–8.5)
eGFR: 116 mL/min/{1.73_m2} (ref >59)

## 2022-03-02 LAB — CBC WITH DIFFERENTIAL/PLATELET
Basophils Absolute: 0 10*3/uL (ref 0.0–0.2)
Basos: 0 %
EOS (ABSOLUTE): 0.1 10*3/uL (ref 0.0–0.4)
Eos: 1 %
Hematocrit: 39.4 % (ref 34.0–46.6)
Hemoglobin: 13.2 g/dL (ref 11.1–15.9)
Immature Grans (Abs): 0 10*3/uL (ref 0.0–0.1)
Immature Granulocytes: 0 %
Lymphocytes Absolute: 1.7 10*3/uL (ref 0.7–3.1)
Lymphs: 24 %
MCH: 31.8 pg (ref 26.6–33.0)
MCHC: 33.5 g/dL (ref 31.5–35.7)
MCV: 95 fL (ref 79–97)
Monocytes Absolute: 0.5 10*3/uL (ref 0.1–0.9)
Monocytes: 8 %
Neutrophils Absolute: 4.6 10*3/uL (ref 1.4–7.0)
Neutrophils: 67 %
Platelets: 461 10*3/uL — ABNORMAL HIGH (ref 150–450)
RBC: 4.15 x10E6/uL (ref 3.77–5.28)
RDW: 12.6 % (ref 11.7–15.4)
WBC: 6.8 10*3/uL (ref 3.4–10.8)

## 2022-03-02 LAB — TSH: TSH: 2.81 u[IU]/mL (ref 0.450–4.500)

## 2022-03-02 LAB — LAMOTRIGINE LEVEL: Lamotrigine Lvl: 15.2 ug/mL (ref 2.0–20.0)

## 2022-03-02 LAB — VITAMIN D 25 HYDROXY (VIT D DEFICIENCY, FRACTURES): Vit D, 25-Hydroxy: 10.2 ng/mL — ABNORMAL LOW (ref 30.0–100.0)

## 2022-03-02 MED ORDER — VITAMIN D (ERGOCALCIFEROL) 50000 UNITS PO CAPS: 1.0000 | ORAL_CAPSULE | ORAL | 0 refills | Status: DC

## 2022-03-02 NOTE — Telephone Encounter (Signed)
I called pt. No answer, left a message asking pt to call me back.   

## 2022-03-02 NOTE — Telephone Encounter (Signed)
Pt returned my call and we discussed results she verbalized understanding and agreement to plan. ?Advised we would call back once lamotrigine levels have resulted.  ?

## 2022-03-02 NOTE — Telephone Encounter (Signed)
Vitamin D level returned quite low at 10.2 TSH normal, lamictal level is pending, CMP normal, platelets very mildly increased at 461. ? ?I will send in Vitamin D 50,000 units once weekly x 8 weeks, then should take 1,000 units OTC daily. ? ?Should be rechecked in about 3 months, can be done with PCP.  ? ?Meds ordered this encounter  ?Medications  ? Vitamin D, Ergocalciferol, 50000 units CAPS  ?  Sig: Take 1 capsule by mouth once a week.  ?  Dispense:  8 capsule  ?  Refill:  0  ?  ? ? ?

## 2022-03-02 NOTE — Addendum Note (Signed)
Addended by: Ann Maki on: 03/02/2022 11:10 AM ? ? Modules accepted: Orders ? ?

## 2022-03-06 ENCOUNTER — Telehealth: Payer: Self-pay | Admitting: *Deleted

## 2022-03-06 NOTE — Telephone Encounter (Signed)
Glean Salvo, NP  P [facility] ? ?Results ?Please call, Lamictal level is within therapeutic range, continue with dosing. Make sure she takes the Vitamin D have rechecked with PCP in about 3 months. Then continue OTC.  ?

## 2022-03-06 NOTE — Telephone Encounter (Signed)
I called patient. I left a detailed message on her cell, per DPR, with these results and recommendations. I asked patient to call back with questions or concerns. ?

## 2023-02-27 ENCOUNTER — Other Ambulatory Visit: Payer: Self-pay | Admitting: Neurology

## 2023-02-28 NOTE — Telephone Encounter (Addendum)
Pt called requesting this medication.

## 2023-03-01 ENCOUNTER — Telehealth: Payer: Self-pay | Admitting: Neurology

## 2023-03-01 MED ORDER — LAMOTRIGINE ER 100 MG PO TB24: ORAL_TABLET | ORAL | 11 refills | Status: DC

## 2023-03-01 NOTE — Telephone Encounter (Signed)
Refill sent routing to lisa boss to inform pt that this was sent in as requested

## 2023-03-01 NOTE — Telephone Encounter (Signed)
Husband states they have been trying to get pt's LamoTRIgine 100 MG TB24 24 hour tablet  refilled since Tuesday, after today pt will be completley out.  Please confirm once processed

## 2023-03-01 NOTE — Telephone Encounter (Signed)
Phone rep called, pt to report message from [facility] re: medication being called in. The voicemail is full unable to leave vm

## 2023-03-05 NOTE — Progress Notes (Deleted)
PATIENT: Debbie Schwartz DOB: [birth year]  REASON FOR VISIT: follow up for seizures HISTORY FROM: patient, husband PRIMARY NEUROLOGIST: Dr. Krista Blue   HISTORY OF PRESENT ILLNESS:Debbie Schwartz is 37 years old right-handed Caucasian female follow-up for seizure,   She had seizures since 2010, had first seizure was in October 15 2009, generalized tonic-clonic seizure, CAT scan was essentially normal, MRI of the brain, and EEG was normal as well.   She had recurrent seizure on November 08 2009, was loaded with Keppra at emergency room, later due to her complains of mood disorder, she was switched to Lamictal, currently taking lamotrigin 100 mg 3 tablets every night, tolerating well,   She went through normal pregnancy twice, while taking lamotrigine, reported last seizure was in 2013.   UPDATE Aug 26 2015: She has no recurrent seizure, was taken to the emergency room October 2015 for an anxiety episode, happened during her psychotherapy section, with hyperventilation, bilateral hands numbness, posturing,   She tolerating lamotrigine xr 100 mg 3 tablets a day, also taking Latuda, her depression is under better control,   Today she complains of excessive fatigue, daytime sleepiness, ESS 10, FSS 43. She also has frequent awakening at night time, frequent body jerking movement.    UPDATE Oct 25th 2017:YY She had one recurrent episode of seizure-like activity during her psychotherapy section for her anxiety disorder, her hands looked up, she became nervous, become confused, but there was no total loss of consciousness, she has been complying with her lamotrigine XL 100 mg 3 tablets every night, this is only medication she is taking.   UPDATE 10/25/2018CM Debbie Schwartz, 37 year old female returns for follow-up with history of seizure disorder and anxiety. She denies any seizure events in the last year. Sleep deprived EEG was normal . Lamictal level 8.1 after last visit which is therapeutic. She  denies side effects to Lamictal. She is currently taking 300 mg daily. She was going to a psychotherapist for her anxiety disorder but stopped going when she was told she was bipolar.She returns for reevaluation. She denies any interval medical issues. She does not exercise UPDATE 12/18/2019CM Debbie Schwartz, 37 year old female returns for follow-up with history of seizure disorder and anxiety.  She denies any seizure events in the last 2 to 3 years.  Sleep deprived EEG in the past was normal she is currently on Lamictal 300 mg daily without side effects.  She stopped going to her psychotherapist for her anxiety disorder when he told her she was bipolar.  She denies any interval medical issues.  She does not exercise.  She returns for reevaluation.  She needs refills on her medication.    Update November 12, 2019 SS: Debbie Schwartz is a 37 year old female with history of seizure disorder and anxiety.  She remains on lamotrigine XR 300 mg daily.  She denies recurrent seizure.  She is tolerating medication without side effect.  She reports continued anxiety.  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  She presents today for evaluation accompanied by her daughter.  Update January 20, 2021 SS: Remains on lamotrigine XR 300 mg daily.  Tolerating well, no recurrent seizure in about 10 years.  She has 3 children, does not work.  No changes to medical history, sees PCP, gets labs.  Update February 28, 2022 SS: No seizures, remains on Lamictal XR 100 mg, 3 tablets daily. Last seizure was 12 years ago. Works part-time doing taxes, does this well, enjoys working. Sleeps fairly well  at night, occasional snoring, hard for her to get going in the morning, daytime fatigue. No longer seeing psychiatry.  Update March 06, 2023 SS:   REVIEW OF SYSTEMS: Out of a complete 14 system review of symptoms, the patient complains only of the following symptoms, and all other reviewed systems are negative.  See  HPI  ALLERGIES: No Known Allergies  HOME MEDICATIONS: Outpatient Medications Prior to Visit  Medication Sig Dispense Refill   lamoTRIgine (LAMICTAL XR) 100 MG 24 hour tablet TAKE (3) TABLETS BY MOUTH ONCE DAILY. 90 tablet 11   naproxen sodium (ALEVE) 220 MG tablet Take 220 mg by mouth daily as needed.     Vitamin D, Ergocalciferol, 50000 units CAPS Take 1 capsule by mouth once a week. 8 capsule 0   No facility-administered medications prior to visit.    PAST MEDICAL HISTORY: Past Medical History:  Diagnosis Date   Anxiety    Depression    Seizures ([place])     PAST SURGICAL HISTORY: Past Surgical History:  Procedure Laterality Date   CESAREAN SECTION     HEMORRHOID SURGERY N/A 12/26/2021   Procedure: HEMORRHOIDECTOMY; EXTENSIVE;  Surgeon: Aviva Signs, MD;  Location: [facility];  Service: General;  Laterality: N/A;    FAMILY HISTORY: Family History  Problem Relation Age of Onset   Diabetes Father     SOCIAL HISTORY: Social History   Socioeconomic History   Marital status: Married    Spouse name: Not on file   Number of children: 2   Years of education: Not on file   Highest education level: Not on file  Occupational History   Not on file  Tobacco Use   Smoking status: Every Day    Packs/day: .5    Types: Cigarettes   Smokeless tobacco: Never  Vaping Use   Vaping Use: Former  Substance and Sexual Activity   Alcohol use: Yes    Comment: occasionally    Drug use: Yes    Types: Marijuana    Comment: last use yesterday   Sexual activity: Yes    Birth control/protection: None  Other Topics Concern   Not on file  Social History Narrative   Not on file   Social Determinants of Health   Financial Resource Strain: Not on file  Food Insecurity: Not on file  Transportation Needs: Not on file  Physical Activity: Not on file  Stress: Not on file  Social Connections: Not on file  Intimate Partner Violence: Not on file   PHYSICAL EXAM  There were no vitals  filed for this visit.   There is no height or weight on file to calculate BMI.  Generalized: Well developed, in no acute distress   Neurological examination  Mentation: Alert oriented to time, place, history taking. Follows all commands speech and language fluent Cranial nerve II-XII: Pupils were equal round reactive to light. Extraocular movements were full, visual field were full on confrontational test. Facial sensation and strength were normal. Head turning and shoulder shrug were normal and symmetric. Motor: The motor testing reveals 5 over 5 strength of all 4 extremities. Good symmetric motor tone is noted throughout.  Sensory: Sensory testing is intact to soft touch on all 4 extremities. No evidence of extinction is noted.  Coordination: Cerebellar testing reveals good finger-nose-finger and heel-to-shin bilaterally.  Gait and station: Gait is normal.  Tandem gait is normal. Reflexes: Deep tendon reflexes are symmetric and normal bilaterally.   DIAGNOSTIC DATA (LABS, IMAGING, TESTING) - I reviewed patient records,  labs, notes, testing and imaging myself where available.  Lab Results  Component Value Date   WBC 6.8 03/01/2022   HGB 13.2 03/01/2022   HCT 39.4 03/01/2022   MCV 95 03/01/2022   PLT 461 (H) 03/01/2022      Component Value Date/Time   NA 136 03/01/2022 1105   K 4.1 03/01/2022 1105   CL 98 03/01/2022 1105   CO2 24 03/01/2022 1105   GLUCOSE 80 03/01/2022 1105   GLUCOSE 90 09/05/2018 1112   BUN 7 03/01/2022 1105   CREATININE 0.70 03/01/2022 1105   CALCIUM 9.4 03/01/2022 1105   PROT 6.9 03/01/2022 1105   ALBUMIN 4.7 03/01/2022 1105   AST 19 03/01/2022 1105   ALT 12 03/01/2022 1105   ALKPHOS 87 03/01/2022 1105   BILITOT 0.2 03/01/2022 1105   GFRNONAA 109 11/12/2019 1411   GFRAA 126 11/12/2019 1411   No results found for: "CHOL", "HDL", "LDLCALC", "LDLDIRECT", "TRIG", "CHOLHDL" No results found for: "HGBA1C" No results found for: "VITAMINB12" Lab Results   Component Value Date   TSH 2.810 03/01/2022    ASSESSMENT AND PLAN 37 y.o. year old female  has a past medical history of Anxiety, Depression, and Seizures ([place]). here with:  1.  Seizures  -Islam continues to do well, last seizure about 12 years ago -Continue lamotrigine XR 100 mg tablet, 3 tablets at bedtime -Check routine labs, will check TSH, vitamin D for fatigue -Recommend follow-up with PCP for routine care -Call for seizure activity, follow-up here 1 year or sooner if needed  Butler Denmark, AGNP-C, DNP 03/05/2023, 1:58 PM [facility] [address] [place], [place] [address] [phone]

## 2023-03-05 NOTE — Telephone Encounter (Signed)
Called and spoke to pt who stated that she did pick up the lamictal.

## 2023-03-06 ENCOUNTER — Ambulatory Visit: Payer: Medicaid Other | Admitting: Neurology

## 2023-03-06 ENCOUNTER — Encounter: Payer: Self-pay | Admitting: Neurology

## 2023-03-12 ENCOUNTER — Ambulatory Visit
Admission: EM | Admit: 2023-03-12 | Discharge: 2023-03-12 | Disposition: A | Discharge status: Home / Self Care | Payer: Medicaid Other | Attending: Physician Assistant | Admitting: Physician Assistant

## 2023-03-12 DIAGNOSIS — K047 Periapical abscess without sinus: Secondary | ICD-10-CM

## 2023-03-12 DIAGNOSIS — K0889 Other specified disorders of teeth and supporting structures: Secondary | ICD-10-CM

## 2023-03-12 MED ORDER — CEFTRIAXONE SODIUM 1 G IJ SOLR
1.0000 g | Freq: Once | INTRAMUSCULAR | Status: AC
  Administered 2023-03-12: 1 g via INTRAMUSCULAR

## 2023-03-12 MED ORDER — CLINDAMYCIN HCL 300 MG PO CAPS: 300.0000 mg | ORAL_CAPSULE | Freq: Three times a day (TID) | ORAL | 0 refills | Status: DC

## 2023-03-12 NOTE — ED Provider Notes (Signed)
[facility]    CSN: [id number] Arrival date & time: 03/12/23  1634      History   Chief Complaint Chief Complaint  Patient presents with   Dental Pain    HPI Debbie Schwartz is a 37 y.o. female.   Patient presents today with 5-day history of left lower jaw pain.  She reports that pain is rated 8/9 on a 0-10 pain scale, described as aching/throbbing, worse with mastication, no alleviating factors identified.  She does report that pain radiates into her left ear.  She denies any recent dental procedure.  She does have a dentist but they are not open for another 48 hours and she could not bear the pain any longer prompting evaluation today.  She has tried multiple over-the-counter medications including Excedrin, Tylenol, NSAIDs.  These have provided minimal relief.  She is also been taking a prescription for amoxicillin 875 mg she had leftover from a previous prescription without improvement.  Denies additional antibiotic use in the past 90 days.  She denies any difficulty speaking, dysphagia, muffled voice, fever.  She has had some nausea but denies any vomiting.    Past Medical History:  Diagnosis Date   Anxiety    Depression    Seizures     Patient Active Problem List   Diagnosis Date Noted   Backache 03/01/2022   Biceps tendinitis 03/01/2022   Strain of neck muscle 03/01/2022   Thrombocytosis 03/01/2022   Condyloma acuminata    Seizures    Anxiety    Depression     Past Surgical History:  Procedure Laterality Date   CESAREAN SECTION     HEMORRHOID SURGERY N/A 12/26/2021   Procedure: HEMORRHOIDECTOMY; EXTENSIVE;  Surgeon: Franky MachoJenkins, Mark, MD;  Location: [facility];  Service: General;  Laterality: N/A;    OB History   No obstetric history on file.      Home Medications    Prior to Admission medications   Medication Sig Start Date End Date Taking? Authorizing Provider  lamoTRIgine (LAMICTAL XR) 100 MG 24 hour tablet TAKE (3) TABLETS BY MOUTH ONCE DAILY.  03/01/23  Yes Glean SalvoSlack, Sarah J, NP  naproxen sodium (ALEVE) 220 MG tablet Take 220 mg by mouth daily as needed.   Yes [provider]  Vitamin D, Ergocalciferol, 50000 units CAPS Take 1 capsule by mouth once a week. 03/02/22  Yes Glean SalvoSlack, Sarah J, NP  clindamycin (CLEOCIN) 300 MG capsule Take 1 capsule (300 mg total) by mouth 3 (three) times daily. 03/12/23   Julias Mould, Noberto RetortErin K, PA-C    Family History Family History  Problem Relation Age of Onset   Diabetes Father     Social History Social History   Tobacco Use   Smoking status: Every Day    Packs/day: .5    Types: Cigarettes   Smokeless tobacco: Never  Vaping Use   Vaping Use: Former  Substance Use Topics   Alcohol use: Yes    Comment: occasionally    Drug use: Yes    Types: Marijuana    Comment: last use yesterday     Allergies   Patient has no known allergies.   Review of Systems Review of Systems  Constitutional:  Positive for activity change. Negative for appetite change, fatigue and fever.  HENT:  Positive for dental problem, ear pain and facial swelling. Negative for congestion, sinus pressure, sneezing and sore throat.   Respiratory:  Negative for cough and shortness of breath.   Cardiovascular:  Negative for chest  pain.  Gastrointestinal:  Negative for abdominal pain, diarrhea, nausea and vomiting.     Physical Exam Triage Vital Signs ED Triage Vitals [03/12/23 1651]  Enc Vitals Group     BP 126/86     Pulse Rate (!) 123     Resp 16     Temp (!) 97.5 F (36.4 C)     Temp Source Oral     SpO2 96 %     Weight      Height      Head Circumference      Peak Flow      Pain Score 8     Pain Loc      Pain Edu?      Excl. in GC?    No data found.  Updated Vital Signs BP 111/76 (BP Location: Right Arm)   Pulse (!) 111   Temp (!) 97.5 F (36.4 C) (Oral)   Resp 16   LMP 02/16/2023 (Approximate)   SpO2 98%   Visual Acuity Right Eye Distance:   Left Eye Distance:   Bilateral Distance:    Right  Eye Near:   Left Eye Near:    Bilateral Near:     Physical Exam Vitals reviewed.  Constitutional:      General: She is awake. She is not in acute distress.    Appearance: Normal appearance. She is well-developed. She is not ill-appearing.     Comments: Very pleasant female appears stated age no acute distress sitting comfortably in exam room  HENT:     Head: Normocephalic and atraumatic.     Right Ear: Tympanic membrane, ear canal and external ear normal. Tympanic membrane is not erythematous or bulging.     Left Ear: Ear canal and external ear normal. A middle ear effusion is present. Tympanic membrane is not erythematous or bulging.     Nose:     Right Sinus: No maxillary sinus tenderness or frontal sinus tenderness.     Left Sinus: No maxillary sinus tenderness or frontal sinus tenderness.     Mouth/Throat:     Dentition: Abnormal dentition. Dental tenderness, gingival swelling and dental abscesses present.     Pharynx: Uvula midline. No oropharyngeal exudate or posterior oropharyngeal erythema.      Comments: No evidence of Ludwig angina Cardiovascular:     Rate and Rhythm: Normal rate and regular rhythm.     Heart sounds: Normal heart sounds, S1 normal and S2 normal. No murmur heard. Pulmonary:     Effort: Pulmonary effort is normal.     Breath sounds: Normal breath sounds. No wheezing, rhonchi or rales.     Comments: Clear to auscultation bilaterally Lymphadenopathy:     Head:     Right side of head: No submental, submandibular or tonsillar adenopathy.     Left side of head: No submental, submandibular or tonsillar adenopathy.     Cervical: No cervical adenopathy.  Psychiatric:        Behavior: Behavior is cooperative.      UC Treatments / Results  Labs (all labs ordered are listed, but only abnormal results are displayed) Labs Reviewed - No data to display  EKG   Radiology No results found.  Procedures Procedures (including critical care time)  Medications  Ordered in UC Medications  cefTRIAXone (ROCEPHIN) injection 1 g (1 g Intramuscular Given 03/12/23 1724)    Initial Impression / Assessment and Plan / UC Course  I have reviewed the triage vital signs and the nursing notes.  Pertinent labs & imaging results that were available during my care of the patient were reviewed by me and considered in my medical decision making (see chart for details).     Patient is mildly tachycardic but otherwise well-appearing, afebrile, nontoxic, nontachycardic.  Her heart rate did improve after sitting quietly for several minutes.  I suspect this is elevated because of her significant pain.  We did discuss that she should monitor this at home and if persistently above 100 she should follow-up with either [place] or her primary care.  If she has any worsening symptoms she needs to be seen immediately.  Treating for dental infection.  She was given 1 g of Rocephin in clinic today.  Will start clindamycin 3 times daily given she is already been taking amoxicillin without improvement of symptoms.  We discussed that this can cause severe diarrhea including C. difficile and if she develops any symptoms she needs to be seen immediately.  Recommend that she continue over-the-counter medications as needed.  She is to follow-up with her dentist first thing tomorrow.  Discussed that if she has any worsening or changing symptoms including swelling of her throat, shortness of breath, muffled voice she needs to be seen immediately.  Strict return precautions given.  Final Clinical Impressions(s) / UC Diagnoses   Final diagnoses:  Dental infection  Dentalgia     Discharge Instructions      We gave an injection of antibiotics today.  Please start clindamycin 3 times daily for the next 7 days.  This can upset your stomach so take it with food.  If you develop any severe diarrhea please stop the medication to be seen immediately.  Continue over-the-counter medications for pain relief.   Follow-up with a dentist as soon as possible.  Your heart rate is slightly elevated which I believe is because you are in pain and not feeling well but please monitor this at home and if it is persistently above 100 follow-up with either [place] or your primary care.  If you develop any chest pain, shortness of breath, lightheadedness, fever, nausea/vomiting you need to be seen immediately.     ED Prescriptions     Medication Sig Dispense Auth. Provider   clindamycin (CLEOCIN) 300 MG capsule  (Status: Discontinued) Take 1 capsule (300 mg total) by mouth 3 (three) times daily. 21 capsule Shalaunda Weatherholtz K, PA-C   clindamycin (CLEOCIN) 300 MG capsule Take 1 capsule (300 mg total) by mouth 3 (three) times daily. 21 capsule Eluzer Howdeshell K, PA-C      I have reviewed the PDMP during this encounter.   Jeani Hawking, PA-C 03/12/23 1735

## 2023-03-12 NOTE — Discharge Instructions (Addendum)
We gave an injection of antibiotics today.  Please start clindamycin 3 times daily for the next 7 days.  This can upset your stomach so take it with food.  If you develop any severe diarrhea please stop the medication to be seen immediately.  Continue over-the-counter medications for pain relief.  Follow-up with a dentist as soon as possible.  Your heart rate is slightly elevated which I believe is because you are in pain and not feeling well but please monitor this at home and if it is persistently above 100 follow-up with either [place] or your primary care.  If you develop any chest pain, shortness of breath, lightheadedness, fever, nausea/vomiting you need to be seen immediately.

## 2023-03-12 NOTE — ED Triage Notes (Signed)
Pt states she has a abscess tooth on her left side, is taking amoxicillin for 4 days now that she had at home, but has not helped the pain or swelling, stared 5 days ago. Pt states she is also having pain and pressure in her left ear.

## 2024-03-22 ENCOUNTER — Ambulatory Visit
Admission: EM | Admit: 2024-03-22 | Discharge: 2024-03-22 | Disposition: A | Discharge status: Home / Self Care | Attending: Family Medicine | Admitting: Family Medicine

## 2024-03-22 ENCOUNTER — Encounter: Payer: Self-pay | Admitting: Emergency Medicine

## 2024-03-22 ENCOUNTER — Other Ambulatory Visit: Payer: Self-pay

## 2024-03-22 DIAGNOSIS — F410 Panic disorder [episodic paroxysmal anxiety] without agoraphobia: Secondary | ICD-10-CM

## 2024-03-22 DIAGNOSIS — R0602 Shortness of breath: Secondary | ICD-10-CM

## 2024-03-22 DIAGNOSIS — F419 Anxiety disorder, unspecified: Secondary | ICD-10-CM | POA: Diagnosis not present

## 2024-03-22 DIAGNOSIS — R079 Chest pain, unspecified: Secondary | ICD-10-CM

## 2024-03-22 MED ORDER — HYDROXYZINE HCL 25 MG PO TABS: 25.0000 mg | ORAL_TABLET | Freq: Three times a day (TID) | ORAL | 0 refills | Status: DC | PRN

## 2024-03-22 NOTE — ED Notes (Signed)
 Initial EKG noted to have artifact right before printing. Repeat Xray completed.   Reviewed pt presentation with provider, provider reported pt stable to return to waiting room and await evaluation/exam room to open up. Pt verbalized understanding.

## 2024-03-22 NOTE — ED Provider Notes (Signed)
 [facility]    CSN: [id number] Arrival date & time: 03/22/24  1351      History   Chief Complaint Chief Complaint  Patient presents with   Anxiety    HPI Debbie Schwartz is a 38 y.o. female.   Patient presenting today with 2 hours of left-sided chest pain and tightness, shortness of breath, hyperventilation that feels very consistent with previous panic attacks.  She states she has uncontrolled anxiety and has tried numerous medications in the past but Xanax  is the only thing that seems to help.  She does not currently have any.  So far not trying anything over-the-counter for the symptoms.  Denies fever, chills, cough, wheezing, abdominal pain, vomiting, diarrhea.  No known cardiac issues in the past.    Past Medical History:  Diagnosis Date   Anxiety    Depression    Seizures (HCC)     Patient Active Problem List   Diagnosis Date Noted   Backache 03/01/2022   Biceps tendinitis 03/01/2022   Strain of neck muscle 03/01/2022   Thrombocytosis 03/01/2022   Condyloma acuminata    Seizures (HCC)    Anxiety    Depression     Past Surgical History:  Procedure Laterality Date   CESAREAN SECTION     HEMORRHOID SURGERY N/A 12/26/2021   Procedure: HEMORRHOIDECTOMY; EXTENSIVE;  Surgeon: Alanda Allegra, MD;  Location: [facility];  Service: General;  Laterality: N/A;    OB History   No obstetric history on file.      Home Medications    Prior to Admission medications   Medication Sig Start Date End Date Taking? Authorizing Provider  hydrOXYzine  (ATARAX ) 25 MG tablet Take 1 tablet (25 mg total) by mouth 3 (three) times daily as needed. May cause drowsiness 03/22/24  Yes Corbin Dess, PA-C  clindamycin  (CLEOCIN ) 300 MG capsule Take 1 capsule (300 mg total) by mouth 3 (three) times daily. 03/12/23   Raspet, Erin K, PA-C  lamoTRIgine  (LAMICTAL  XR) 100 MG 24 hour tablet TAKE (3) TABLETS BY MOUTH ONCE DAILY. 03/01/23   Wess Hammed, NP  naproxen sodium  (ALEVE) 220 MG tablet Take 220 mg by mouth daily as needed.    [provider]  Vitamin D , Ergocalciferol , 50000 units CAPS Take 1 capsule by mouth once a week. 03/02/22   Wess Hammed, NP    Family History Family History  Problem Relation Age of Onset   Diabetes Father     Social History Social History   Tobacco Use   Smoking status: Every Day    Current packs/day: 0.50    Types: Cigarettes   Smokeless tobacco: Never  Vaping Use   Vaping status: Every Day  Substance Use Topics   Alcohol use: Yes    Comment: occasionally    Drug use: Yes    Types: Marijuana    Comment: x3 days     Allergies   Patient has no known allergies.   Review of Systems Review of Systems PER HPI  Physical Exam Triage Vital Signs ED Triage Vitals  Encounter Vitals Group     BP 03/22/24 1358 102/74     Systolic BP Percentile --      Diastolic BP Percentile --      Pulse Rate 03/22/24 1358 (!) 109     Resp 03/22/24 1358 20     Temp 03/22/24 1358 97.6 F (36.4 C)     Temp Source 03/22/24 1358 Oral     SpO2  03/22/24 1358 99 %     Weight --      Height --      Head Circumference --      Peak Flow --      Pain Score 03/22/24 1359 8     Pain Loc --      Pain Education --      Exclude from Growth Chart --    No data found.  Updated Vital Signs BP 102/74 (BP Location: Right Arm)   Pulse (!) 109   Temp 97.6 F (36.4 C) (Oral)   Resp 20   LMP 03/21/2024 (Approximate)   SpO2 99%   Visual Acuity Right Eye Distance:   Left Eye Distance:   Bilateral Distance:    Right Eye Near:   Left Eye Near:    Bilateral Near:     Physical Exam Vitals and nursing note reviewed.  Constitutional:      Appearance: Normal appearance. She is not ill-appearing.  HENT:     Head: Atraumatic.  Eyes:     Extraocular Movements: Extraocular movements intact.     Conjunctiva/sclera: Conjunctivae normal.  Cardiovascular:     Rate and Rhythm: Normal rate and regular rhythm.     Heart  sounds: Normal heart sounds.  Pulmonary:     Effort: Pulmonary effort is normal.     Breath sounds: Normal breath sounds.  Musculoskeletal:        General: Normal range of motion.     Cervical back: Normal range of motion and neck supple.  Skin:    General: Skin is warm and dry.  Neurological:     Mental Status: She is alert and oriented to person, place, and time.     Motor: No weakness.     Gait: Gait normal.  Psychiatric:        Mood and Affect: Mood normal.        Thought Content: Thought content normal.        Judgment: Judgment normal.      UC Treatments / Results  Labs (all labs ordered are listed, but only abnormal results are displayed) Labs Reviewed - No data to display  EKG   Radiology No results found.  Procedures Procedures (including critical care time)  Medications Ordered in UC Medications - No data to display  Initial Impression / Assessment and Plan / UC Course  I have reviewed the triage vital signs and the nursing notes.  Pertinent labs & imaging results that were available during my care of the patient were reviewed by me and considered in my medical decision making (see chart for details).     Mildly tachycardic to 109 bpm in triage, this improved to 77 bpm during exam.  EKG today normal sinus rhythm at 98 bpm without acute ST or T wave changes.  She states she is feeling much better with no significant residual symptoms after sitting here waiting to be seen.  Do suspect anxiety/panic attack to be causing symptoms.  Will treat with hydroxyzine  and close PCP follow-up.  Return for worsening symptoms.  Final Clinical Impressions(s) / UC Diagnoses   Final diagnoses:  Anxiety  Panic attack  Chest pain, unspecified type  SOB (shortness of breath)   Discharge Instructions   None    ED Prescriptions     Medication Sig Dispense Auth. Provider   hydrOXYzine  (ATARAX ) 25 MG tablet Take 1 tablet (25 mg total) by mouth 3 (three) times daily as  needed. May cause drowsiness 30  tablet Corbin Dess, [place]      PDMP not reviewed this encounter.   Corbin Dess, [place] 03/22/24 1547

## 2024-03-22 NOTE — ED Notes (Addendum)
 CMA went in to discharge patient to review prescriptions and care plan. Pt reported "I drink on the weekends and that medicine isn't going to work." CMA reported could call and cancel prescription and pt verbalized understanding. Pt left UC room agitated and stated "this is ridiculous". Pt pharmacy contacted by this RN and discharge prescription cancelled, provider aware.

## 2024-03-22 NOTE — ED Triage Notes (Addendum)
 Pt reports chest pain, shortness of breath that started at noon today.denies any change in pain with movement or deep breath. Pt reports hx of similar in the past and reports "it feels like when I have a panic attack." Pt is tearful. Reports xanax  has helped in the past. Reports other medications that have prescribed have not seemed to help.   Discussed pt complaint, presentation and triage note with provider and pt aware unable to prescribe xanax  at this UC. Verbalized understanding. Tearfulness improved during triage. NAD noted.

## 2024-04-03 ENCOUNTER — Telehealth: Payer: Self-pay

## 2024-04-03 ENCOUNTER — Other Ambulatory Visit: Payer: Self-pay | Admitting: Neurology

## 2024-04-03 NOTE — Telephone Encounter (Signed)
 Call to patient, advised that appt is needed for refills. Agreeable to 5/13 appt.

## 2024-04-15 ENCOUNTER — Ambulatory Visit: Admitting: Neurology

## 2024-04-15 ENCOUNTER — Encounter: Payer: Self-pay | Admitting: Neurology

## 2024-04-15 VITALS — BP 117/76 | HR 106 | Ht 64.0 in | Wt 139.5 lb

## 2024-04-15 DIAGNOSIS — R569 Unspecified convulsions: Secondary | ICD-10-CM

## 2024-04-15 MED ORDER — LAMOTRIGINE ER 300 MG PO TB24: 300.0000 mg | ORAL_TABLET | Freq: Every day | ORAL | 4 refills | Status: AC

## 2024-04-15 NOTE — Patient Instructions (Signed)
 Check labs today. Continue Lamictal  XR 300 mg daily for seizure prevention.   Meds ordered this encounter  Medications   LamoTRIgine  (LAMICTAL  XR) 300 MG TB24 24 hour tablet    Sig: Take 1 tablet (300 mg total) by mouth daily.    Dispense:  90 tablet    Refill:  4

## 2024-04-15 NOTE — Progress Notes (Signed)
 PATIENT: Debbie Schwartz DOB: October 09, 1986  REASON FOR VISIT: follow up for seizures HISTORY FROM: patient, husband PRIMARY NEUROLOGIST: Dr. Gracie Lav   ASSESSMENT AND PLAN 38 y.o. year old female  has a past medical history of Anxiety, Depression, and Seizures (HCC). here with:  1.  Seizures  -Last seizure was around 2013 - Check routine labs today, vitamin D  level -Will change from Lamictal  XR 100 mg 3 capsules daily to single 300 mg capsule for simplicity of medication regimen -Call for seizure activity, follow-up here 1 year or sooner if needed  Meds ordered this encounter  Medications   LamoTRIgine  (LAMICTAL  XR) 300 MG TB24 24 hour tablet    Sig: Take 1 tablet (300 mg total) by mouth daily.    Dispense:  90 tablet    Refill:  4   Orders Placed This Encounter  Procedures   CBC with Differential/Platelet   CMP   Lamotrigine  level   Vitamin D , 25-hydroxy   HISTORY OF PRESENT ILLNESS:Debbie Schwartz is 37 years old right-handed Caucasian female follow-up for seizure,   She had seizures since 2010, had first seizure was in October 15 2009, generalized tonic-clonic seizure, CAT scan was essentially normal, MRI of the brain, and EEG was normal as well.   She had recurrent seizure on November 08 2009, was loaded with Keppra at emergency room, later due to her complains of mood disorder, she was switched to Lamictal , currently taking lamotrigin 100 mg 3 tablets every night, tolerating well,   She went through normal pregnancy twice, while taking lamotrigine , reported last seizure was in 2013.   UPDATE Aug 26 2015: She has no recurrent seizure, was taken to the emergency room October 2015 for an anxiety episode, happened during her psychotherapy section, with hyperventilation, bilateral hands numbness, posturing,   She tolerating lamotrigine  xr 100 mg 3 tablets a day, also taking Latuda, her depression is under better control,   Today she complains of excessive fatigue, daytime  sleepiness, ESS 10, FSS 43. She also has frequent awakening at night time, frequent body jerking movement.    UPDATE Oct 25th 2017:YY She had one recurrent episode of seizure-like activity during her psychotherapy section for her anxiety disorder, her hands looked up, she became nervous, become confused, but there was no total loss of consciousness, she has been complying with her lamotrigine  XL 100 mg 3 tablets every night, this is only medication she is taking.   UPDATE 10/25/2018CM Debbie Schwartz, 38 year old female returns for follow-up with history of seizure disorder and anxiety. She denies any seizure events in the last year. Sleep deprived EEG was normal . Lamictal  level 8.1 after last visit which is therapeutic. She denies side effects to Lamictal . She is currently taking 300 mg daily. She was going to a psychotherapist for her anxiety disorder but stopped going when she was told she was bipolar.She returns for reevaluation. She denies any interval medical issues. She does not exercise UPDATE 12/18/2019CM Debbie Schwartz, 38 year old female returns for follow-up with history of seizure disorder and anxiety.  She denies any seizure events in the last 2 to 3 years.  Sleep deprived EEG in the past was normal she is currently on Lamictal  300 mg daily without side effects.  She stopped going to her psychotherapist for her anxiety disorder when he told her she was bipolar.  She denies any interval medical issues.  She does not exercise.  She returns for reevaluation.  She needs refills on her medication.  Update November 12, 2019 SS: Debbie Schwartz is a 38 year old female with history of seizure disorder and anxiety.  She remains on lamotrigine  XR 300 mg daily.  She denies recurrent seizure.  She is tolerating medication without side effect.  She reports continued anxiety.  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  She presents today for evaluation accompanied by her daughter.  Update January 20, 2021 SS: Remains on lamotrigine  XR 300 mg daily.  Tolerating well, no recurrent seizure in about 10 years.  She has 3 children, does not work.  No changes to medical history, sees PCP, gets labs.  Update February 28, 2022 SS: No seizures, remains on Lamictal  XR 100 mg, 3 tablets daily. Last seizure was 12 years ago. Works part-time doing taxes, does this well, enjoys working. Sleeps fairly well at night, occasional snoring, hard for her to get going in the morning, daytime fatigue. No longer seeing psychiatry.  Update Apr 15, 2024 SS: Labs in March 2023 showed Lamictal  level 15.2, TSH 2.810, vitamin D  10.2.  Remains on Lamictal  XR 100 mg 3 capsules daily. Last seizure was 13 years ago. Struggles with anxiety, unable to tolerate medication. No longer sees psychiatrist. Not working right now, 4 kids. Has had tubal ligation.   REVIEW OF SYSTEMS: Out of a complete 14 system review of symptoms, the patient complains only of the following symptoms, and all other reviewed systems are negative.  See HPI  ALLERGIES: Allergies  Allergen Reactions   Atarax  [Hydroxyzine ]     Made her have anxiety and chest pain    HOME MEDICATIONS: Outpatient Medications Prior to Visit  Medication Sig Dispense Refill   lamoTRIgine  (LAMICTAL  XR) 100 MG 24 hour tablet TAKE THREE TABLETS BY MOUTH EVERY DAY 90 tablet 0   clindamycin  (CLEOCIN ) 300 MG capsule Take 1 capsule (300 mg total) by mouth 3 (three) times daily. 21 capsule 0   hydrOXYzine  (ATARAX ) 25 MG tablet Take 1 tablet (25 mg total) by mouth 3 (three) times daily as needed. May cause drowsiness 30 tablet 0   naproxen sodium (ALEVE) 220 MG tablet Take 220 mg by mouth daily as needed.     Vitamin D , Ergocalciferol , 50000 units CAPS Take 1 capsule by mouth once a week. 8 capsule 0   No facility-administered medications prior to visit.    PAST MEDICAL HISTORY: Past Medical History:  Diagnosis Date   Anxiety    Depression    Seizures (HCC)     PAST  SURGICAL HISTORY: Past Surgical History:  Procedure Laterality Date   CESAREAN SECTION     HEMORRHOID SURGERY N/A 12/26/2021   Procedure: HEMORRHOIDECTOMY; EXTENSIVE;  Surgeon: Alanda Allegra, MD;  Location: [facility];  Service: General;  Laterality: N/A;    FAMILY HISTORY: Family History  Problem Relation Age of Onset   Diabetes Father     SOCIAL HISTORY: Social History   Socioeconomic History   Marital status: Married    Spouse name: william   Number of children: 4   Years of education: Not on file   Highest education level: GED or equivalent  Occupational History   Not on file  Tobacco Use   Smoking status: Every Day    Current packs/day: 0.50    Types: Cigarettes    Passive exposure: Current   Smokeless tobacco: Never  Vaping Use   Vaping status: Every Day   Substances: Nicotine, Flavoring  Substance and Sexual Activity   Alcohol use: Yes    Alcohol/week:  1.0 standard drink of alcohol    Types: 1 Standard drinks or equivalent per week    Comment: occasionally    Drug use: Yes    Types: Marijuana    Comment: x3 days   Sexual activity: Yes    Birth control/protection: None  Other Topics Concern   Not on file  Social History Narrative   Not on file   Social Drivers of Health   Financial Resource Strain: Not on file  Food Insecurity: Not on file  Transportation Needs: Not on file  Physical Activity: Not on file  Stress: Not on file  Social Connections: Not on file  Intimate Partner Violence: Not on file   PHYSICAL EXAM  Vitals:   04/15/24 1417  BP: 117/76  Pulse: (!) 106  SpO2: 98%  Weight: 139 lb 8 oz (63.3 kg)  Height: 5\' 4"  (1.626 m)   Body mass index is 23.95 kg/m.  Generalized: Well developed, in no acute distress   Neurological examination  Mentation: Alert oriented to time, place, history taking. Follows all commands speech and language fluent Cranial nerve II-XII: Pupils were equal round reactive to light. Extraocular movements were full,  visual field were full on confrontational test. Facial sensation and strength were normal. Head turning and shoulder shrug were normal and symmetric. Motor: The motor testing reveals 5 over 5 strength of all 4 extremities. Good symmetric motor tone is noted throughout.  Sensory: Sensory testing is intact to soft touch on all 4 extremities. No evidence of extinction is noted.  Coordination: Cerebellar testing reveals good finger-nose-finger and heel-to-shin bilaterally.  Gait and station: Gait is normal.  Reflexes: Deep tendon reflexes are symmetric and normal bilaterally.   DIAGNOSTIC DATA (LABS, IMAGING, TESTING) - I reviewed patient records, labs, notes, testing and imaging myself where available.  Lab Results  Component Value Date   WBC 6.8 03/01/2022   HGB 13.2 03/01/2022   HCT 39.4 03/01/2022   MCV 95 03/01/2022   PLT 461 (H) 03/01/2022      Component Value Date/Time   NA 136 03/01/2022 1105   K 4.1 03/01/2022 1105   CL 98 03/01/2022 1105   CO2 24 03/01/2022 1105   GLUCOSE 80 03/01/2022 1105   GLUCOSE 90 09/05/2018 1112   BUN 7 03/01/2022 1105   CREATININE 0.70 03/01/2022 1105   CALCIUM 9.4 03/01/2022 1105   PROT 6.9 03/01/2022 1105   ALBUMIN 4.7 03/01/2022 1105   AST 19 03/01/2022 1105   ALT 12 03/01/2022 1105   ALKPHOS 87 03/01/2022 1105   BILITOT 0.2 03/01/2022 1105   GFRNONAA 109 11/12/2019 1411   GFRAA 126 11/12/2019 1411   No results found for: "CHOL", "HDL", "LDLCALC", "LDLDIRECT", "TRIG", "CHOLHDL" No results found for: "HGBA1C" No results found for: "VITAMINB12" Lab Results  Component Value Date   TSH 2.810 03/01/2022   Jeanmarie Millet, AGNP-C, DNP 04/15/2024, 2:22 PM [facility] [address] [place], [place] [address] [phone]

## 2024-04-16 LAB — CBC WITH DIFFERENTIAL/PLATELET
Basophils Absolute: 0 10*3/uL (ref 0.0–0.2)
Basos: 1 %
EOS (ABSOLUTE): 0.1 10*3/uL (ref 0.0–0.4)
Eos: 1 %
Hematocrit: 41.6 % (ref 34.0–46.6)
Hemoglobin: 14 g/dL (ref 11.1–15.9)
Immature Grans (Abs): 0 10*3/uL (ref 0.0–0.1)
Immature Granulocytes: 0 %
Lymphocytes Absolute: 1.3 10*3/uL (ref 0.7–3.1)
Lymphs: 29 %
MCH: 32.8 pg (ref 26.6–33.0)
MCHC: 33.7 g/dL (ref 31.5–35.7)
MCV: 97 fL (ref 79–97)
Monocytes Absolute: 0.4 10*3/uL (ref 0.1–0.9)
Monocytes: 9 %
Neutrophils Absolute: 2.7 10*3/uL (ref 1.4–7.0)
Neutrophils: 60 %
Platelets: 474 10*3/uL — ABNORMAL HIGH (ref 150–450)
RBC: 4.27 x10E6/uL (ref 3.77–5.28)
RDW: 13.1 % (ref 11.7–15.4)
WBC: 4.6 10*3/uL (ref 3.4–10.8)

## 2024-04-16 LAB — COMPREHENSIVE METABOLIC PANEL WITH GFR
ALT: 18 IU/L (ref 0–32)
AST: 20 IU/L (ref 0–40)
Albumin: 4.7 g/dL (ref 3.9–4.9)
Alkaline Phosphatase: 88 IU/L (ref 44–121)
BUN/Creatinine Ratio: 5 — ABNORMAL LOW (ref 9–23)
BUN: 4 mg/dL — ABNORMAL LOW (ref 6–20)
Bilirubin Total: 0.4 mg/dL (ref 0.0–1.2)
CO2: 23 mmol/L (ref 20–29)
Calcium: 10 mg/dL (ref 8.7–10.2)
Chloride: 102 mmol/L (ref 96–106)
Creatinine, Ser: 0.85 mg/dL (ref 0.57–1.00)
Globulin, Total: 2.7 g/dL (ref 1.5–4.5)
Glucose: 82 mg/dL (ref 70–99)
Potassium: 4.2 mmol/L (ref 3.5–5.2)
Sodium: 140 mmol/L (ref 134–144)
Total Protein: 7.4 g/dL (ref 6.0–8.5)
eGFR: 91 mL/min/{1.73_m2} (ref >59)

## 2024-04-16 LAB — LAMOTRIGINE LEVEL: Lamotrigine Lvl: 7.3 ug/mL (ref 2.0–20.0)

## 2024-04-16 LAB — VITAMIN D 25 HYDROXY (VIT D DEFICIENCY, FRACTURES): Vit D, 25-Hydroxy: 28.9 ng/mL — ABNORMAL LOW (ref 30.0–100.0)

## 2024-04-17 ENCOUNTER — Ambulatory Visit: Payer: Self-pay | Admitting: Neurology

## 2024-04-17 ENCOUNTER — Telehealth: Payer: Self-pay | Admitting: Neurology

## 2024-04-17 NOTE — Telephone Encounter (Signed)
 Patient returned phone call,would like a call back.

## 2024-04-17 NOTE — Telephone Encounter (Signed)
 Phone room: please reschedule pt

## 2024-04-17 NOTE — Telephone Encounter (Signed)
 Pt called to cancel appointment due to seen Isa Manuel, NP on 04/15/24

## 2025-01-05 ENCOUNTER — Emergency Department (HOSPITAL_COMMUNITY)
Admission: EM | Admit: 2025-01-05 | Discharge: 2025-01-05 | Disposition: A | Discharge status: Home / Self Care | Attending: Emergency Medicine | Admitting: Emergency Medicine

## 2025-01-05 ENCOUNTER — Other Ambulatory Visit: Payer: Self-pay

## 2025-01-05 DIAGNOSIS — W19XXXA Unspecified fall, initial encounter: Secondary | ICD-10-CM

## 2025-01-05 DIAGNOSIS — Z23 Encounter for immunization: Secondary | ICD-10-CM | POA: Insufficient documentation

## 2025-01-05 DIAGNOSIS — S81811A Laceration without foreign body, right lower leg, initial encounter: Secondary | ICD-10-CM | POA: Insufficient documentation

## 2025-01-05 DIAGNOSIS — Y92002 Bathroom of unspecified non-institutional (private) residence single-family (private) house as the place of occurrence of the external cause: Secondary | ICD-10-CM | POA: Insufficient documentation

## 2025-01-05 DIAGNOSIS — W01118A Fall on same level from slipping, tripping and stumbling with subsequent striking against other sharp object, initial encounter: Secondary | ICD-10-CM | POA: Insufficient documentation

## 2025-01-05 DIAGNOSIS — Y9301 Activity, walking, marching and hiking: Secondary | ICD-10-CM | POA: Insufficient documentation

## 2025-01-05 MED ORDER — TETANUS-DIPHTH-ACELL PERTUSSIS 5-2-15.5 LF-MCG/0.5 IM SUSP
0.5000 mL | Freq: Once | INTRAMUSCULAR | Status: AC
  Administered 2025-01-05: 0.5 mL via INTRAMUSCULAR
  Filled 2025-01-05: qty 0.5

## 2025-01-05 NOTE — ED Triage Notes (Signed)
 Patient brought in by POV with c/o falling and cutting her right shin. Patient alert and oriented x4.

## 2025-01-05 NOTE — Discharge Instructions (Signed)
 Keep area wound clean.  Steri-Strips should fall off over the next several days.  If area becomes increasingly red, swollen, painful, warm, or begins draining pus, return to the emergency department.

## 2025-04-21 ENCOUNTER — Ambulatory Visit: Admitting: Neurology
